# Patient Record
Sex: Male | Born: 1960 | Race: White | Hispanic: No | Smoking: Former smoker
Health system: Southern US, Community
[De-identification: ages and names within clinical notes are randomized; demographics above are authoritative.]

## PROBLEM LIST (undated history)

## (undated) DIAGNOSIS — J8410 Pulmonary fibrosis, unspecified: Secondary | ICD-10-CM

## (undated) DIAGNOSIS — I700 Atherosclerosis of aorta: Secondary | ICD-10-CM

## (undated) DIAGNOSIS — R42 Dizziness and giddiness: Secondary | ICD-10-CM

## (undated) DIAGNOSIS — M1990 Unspecified osteoarthritis, unspecified site: Secondary | ICD-10-CM

## (undated) DIAGNOSIS — M109 Gout, unspecified: Secondary | ICD-10-CM

## (undated) DIAGNOSIS — M25561 Pain in right knee: Secondary | ICD-10-CM

## (undated) DIAGNOSIS — Z8619 Personal history of other infectious and parasitic diseases: Secondary | ICD-10-CM

## (undated) DIAGNOSIS — J449 Chronic obstructive pulmonary disease, unspecified: Secondary | ICD-10-CM

## (undated) DIAGNOSIS — F32A Depression, unspecified: Secondary | ICD-10-CM

## (undated) DIAGNOSIS — F319 Bipolar disorder, unspecified: Secondary | ICD-10-CM

## (undated) DIAGNOSIS — E785 Hyperlipidemia, unspecified: Secondary | ICD-10-CM

## (undated) DIAGNOSIS — H25019 Cortical age-related cataract, unspecified eye: Secondary | ICD-10-CM

## (undated) DIAGNOSIS — F419 Anxiety disorder, unspecified: Secondary | ICD-10-CM

## (undated) DIAGNOSIS — F329 Major depressive disorder, single episode, unspecified: Secondary | ICD-10-CM

## (undated) DIAGNOSIS — F4310 Post-traumatic stress disorder, unspecified: Secondary | ICD-10-CM

## (undated) DIAGNOSIS — J189 Pneumonia, unspecified organism: Secondary | ICD-10-CM

## (undated) HISTORY — PX: EYE SURGERY: SHX253

## (undated) HISTORY — DX: Hyperlipidemia, unspecified: E78.5

## (undated) HISTORY — PX: FOOT SURGERY: SHX648

## (undated) HISTORY — PX: HERNIA REPAIR: SHX51

## (undated) HISTORY — PX: OTHER SURGICAL HISTORY: SHX169

## (undated) HISTORY — DX: Gout, unspecified: M10.9

## (undated) HISTORY — PX: WISDOM TOOTH EXTRACTION: SHX21

## (undated) HISTORY — DX: Anxiety disorder, unspecified: F41.9

## (undated) HISTORY — PX: CATARACT EXTRACTION W/ INTRAOCULAR LENS  IMPLANT, BILATERAL: SHX1307

## (undated) HISTORY — PX: TONSILLECTOMY: SUR1361

## (undated) HISTORY — PX: COLONOSCOPY: SHX174

---
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
## 2007-05-25 ENCOUNTER — Other Ambulatory Visit: Payer: Self-pay

## 2007-05-25 ENCOUNTER — Inpatient Hospital Stay: Payer: Self-pay | Admitting: *Deleted

## 2007-07-13 ENCOUNTER — Ambulatory Visit: Payer: Self-pay | Admitting: Specialist

## 2010-01-24 ENCOUNTER — Emergency Department: Payer: Self-pay | Admitting: Emergency Medicine

## 2013-06-11 ENCOUNTER — Ambulatory Visit: Payer: Self-pay | Admitting: Gastroenterology

## 2013-06-14 LAB — PATHOLOGY REPORT

## 2013-09-17 DIAGNOSIS — F319 Bipolar disorder, unspecified: Secondary | ICD-10-CM | POA: Insufficient documentation

## 2013-09-17 DIAGNOSIS — J449 Chronic obstructive pulmonary disease, unspecified: Secondary | ICD-10-CM | POA: Insufficient documentation

## 2013-09-17 DIAGNOSIS — J8410 Pulmonary fibrosis, unspecified: Secondary | ICD-10-CM | POA: Insufficient documentation

## 2015-06-01 ENCOUNTER — Encounter: Payer: Self-pay | Admitting: *Deleted

## 2015-06-07 NOTE — Discharge Instructions (Signed)
[facility] °[facility] ° °POST OPERATIVE INSTRUCTIONS FOR DR. TROXLER AND DR. FOWLER °[facility] ° ° °1. Take your medication as prescribed.  Pain medication should be taken only as needed. ° °2. Keep the dressing clean, dry and intact. ° °3. Keep your foot elevated above the heart level for the first 48 hours. ° °4. Walking to the bathroom and brief periods of walking are acceptable, unless we have instructed you to be non-weight bearing. ° °5. Always wear your post-op shoe when walking.  Always use your crutches if you are to be non-weight bearing. ° °6. Do not take a shower. Baths are permissible as long as the foot is kept out of the water.  ° °7. Every hour you are awake:  °- Bend your knee 15 times. °- Flex foot 15 times °- Massage calf 15 times ° °8. Call [facility] ([phone]) if any of the following problems occur: °- You develop a temperature or fever. °- The bandage becomes saturated with blood. °- Medication does not stop your pain. °- Injury of the foot occurs. °- Any symptoms of infection including redness, odor, or red streaks running from wound. ° °General Anesthesia, Adult, Care After °Refer to this sheet in the next few weeks. These instructions provide you with information on caring for yourself after your procedure. Your health care provider may also give you more specific instructions. Your treatment has been planned according to current medical practices, but problems sometimes occur. Call your health care provider if you have any problems or questions after your procedure. °WHAT TO EXPECT AFTER THE PROCEDURE °After the procedure, it is typical to experience: °· Sleepiness. °· Nausea and vomiting. °HOME CARE INSTRUCTIONS °· For the first 24 hours after general anesthesia: °¨ Have a responsible person with you. °¨ Do not drive a car. If you are alone, do not take public transportation. °¨ Do not drink alcohol. °¨ Do not take  medicine that has not been prescribed by your health care provider. °¨ Do not sign important papers or make important decisions. °¨ You may resume a normal diet and activities as directed by your health care provider. °· Change bandages (dressings) as directed. °· If you have questions or problems that seem related to general anesthesia, call the hospital and ask for the anesthetist or anesthesiologist on call. °SEEK MEDICAL CARE IF: °· You have nausea and vomiting that continue the day after anesthesia. °· You develop a rash. °SEEK IMMEDIATE MEDICAL CARE IF:  °· You have difficulty breathing. °· You have chest pain. °· You have any allergic problems. °  °This information is not intended to replace advice given to you by your health care provider. Make sure you discuss any questions you have with your health care provider. °  °Document Released: 05/06/2000 Document Revised: 02/18/2014 Document Reviewed: 05/29/2011 °Elsevier Interactive Patient Education ©2016 [employer] ° °

## 2015-06-08 ENCOUNTER — Ambulatory Visit
Admission: RE | Admit: 2015-06-08 | Discharge: 2015-06-08 | Disposition: A | Discharge status: Home / Self Care | Payer: Managed Care, Other (non HMO) | Source: Ambulatory Visit | Attending: Podiatry | Admitting: Podiatry

## 2015-06-08 ENCOUNTER — Ambulatory Visit: Payer: Managed Care, Other (non HMO) | Admitting: Anesthesiology

## 2015-06-08 ENCOUNTER — Encounter
Admission: RE | Disposition: A | Discharge status: Home / Self Care | Payer: Self-pay | Source: Ambulatory Visit | Attending: Podiatry

## 2015-06-08 ENCOUNTER — Encounter: Payer: Self-pay | Admitting: *Deleted

## 2015-06-08 DIAGNOSIS — Z720 Tobacco use: Secondary | ICD-10-CM | POA: Insufficient documentation

## 2015-06-08 DIAGNOSIS — M899 Disorder of bone, unspecified: Secondary | ICD-10-CM | POA: Diagnosis not present

## 2015-06-08 DIAGNOSIS — J449 Chronic obstructive pulmonary disease, unspecified: Secondary | ICD-10-CM | POA: Insufficient documentation

## 2015-06-08 DIAGNOSIS — G8929 Other chronic pain: Secondary | ICD-10-CM | POA: Diagnosis present

## 2015-06-08 HISTORY — DX: Bipolar disorder, unspecified: F31.9

## 2015-06-08 HISTORY — DX: Personal history of other infectious and parasitic diseases: Z86.19

## 2015-06-08 HISTORY — PX: EXCISION PARTIAL PHALANX: SHX6617

## 2015-06-08 HISTORY — DX: Chronic obstructive pulmonary disease, unspecified: J44.9

## 2015-06-08 HISTORY — DX: Depression, unspecified: F32.A

## 2015-06-08 HISTORY — DX: Pain in right knee: M25.561

## 2015-06-08 HISTORY — DX: Major depressive disorder, single episode, unspecified: F32.9

## 2015-06-08 HISTORY — DX: Unspecified osteoarthritis, unspecified site: M19.90

## 2015-06-08 HISTORY — DX: Pulmonary fibrosis, unspecified: J84.10

## 2015-06-08 HISTORY — DX: Dizziness and giddiness: R42

## 2015-06-08 SURGERY — EXCISION, PHALANX, PARTIAL
Anesthesia: General | Site: Foot | Laterality: Right | Wound class: Clean

## 2015-06-08 MED ORDER — ONDANSETRON HCL 4 MG/2ML IJ SOLN
INTRAMUSCULAR | Status: DC | PRN
  Administered 2015-06-08: 4 mg via INTRAVENOUS

## 2015-06-08 MED ORDER — OXYCODONE HCL 5 MG/5ML PO SOLN: 5.0000 mg | Freq: Once | ORAL | Status: DC | PRN

## 2015-06-08 MED ORDER — LACTATED RINGERS IV SOLN
INTRAVENOUS | Status: DC
  Administered 2015-06-08: 08:00:00 via INTRAVENOUS

## 2015-06-08 MED ORDER — CEFAZOLIN SODIUM-DEXTROSE 2-4 GM/100ML-% IV SOLN
2.0000 g | Freq: Once | INTRAVENOUS | Status: AC
  Administered 2015-06-08: 2 g via INTRAVENOUS

## 2015-06-08 MED ORDER — FENTANYL CITRATE (PF) 100 MCG/2ML IJ SOLN
INTRAMUSCULAR | Status: DC | PRN
  Administered 2015-06-08 (×2): 50 ug via INTRAVENOUS

## 2015-06-08 MED ORDER — PROPOFOL 500 MG/50ML IV EMUL
INTRAVENOUS | Status: DC | PRN
Start: 2015-06-08 — End: 2015-06-08
  Administered 2015-06-08: 50 ug/kg/min via INTRAVENOUS

## 2015-06-08 MED ORDER — HYDROMORPHONE HCL 1 MG/ML IJ SOLN: 0.2500 mg | INTRAMUSCULAR | Status: DC | PRN

## 2015-06-08 MED ORDER — BUPIVACAINE HCL (PF) 0.5 % IJ SOLN
INTRAMUSCULAR | Status: DC | PRN
Start: 2015-06-08 — End: 2015-06-08
  Administered 2015-06-08: 2 mL
  Administered 2015-06-08: 5 mL
  Administered 2015-06-08: 1 mL

## 2015-06-08 MED ORDER — MIDAZOLAM HCL 5 MG/5ML IJ SOLN
INTRAMUSCULAR | Status: DC | PRN
  Administered 2015-06-08: 2 mg via INTRAVENOUS

## 2015-06-08 MED ORDER — HYDROCODONE-ACETAMINOPHEN 7.5-325 MG PO TABS: 1.0000 | ORAL_TABLET | Freq: Four times a day (QID) | ORAL | Status: DC | PRN

## 2015-06-08 MED ORDER — LACTATED RINGERS IV SOLN: 500.0000 mL | INTRAVENOUS | Status: DC

## 2015-06-08 MED ORDER — OXYCODONE HCL 5 MG PO TABS: 5.0000 mg | ORAL_TABLET | Freq: Once | ORAL | Status: DC | PRN

## 2015-06-08 MED ORDER — DIPHENHYDRAMINE HCL 50 MG/ML IJ SOLN
INTRAMUSCULAR | Status: DC | PRN
  Administered 2015-06-08: 12.5 mg via INTRAVENOUS

## 2015-06-08 MED ORDER — LIDOCAINE HCL (CARDIAC) 20 MG/ML IV SOLN
INTRAVENOUS | Status: DC | PRN
  Administered 2015-06-08: 40 mg via INTRAVENOUS

## 2015-06-08 MED ORDER — ONDANSETRON HCL 4 MG/2ML IJ SOLN: 4.0000 mg | Freq: Once | INTRAMUSCULAR | Status: DC | PRN

## 2015-06-08 SURGICAL SUPPLY — 50 items
BANDAGE ELASTIC 4 VELCRO NS (GAUZE/BANDAGES/DRESSINGS) ×6 IMPLANT
BENZOIN TINCTURE PRP APPL 2/3 (GAUZE/BANDAGES/DRESSINGS) IMPLANT
BLADE MINI RND TIP GREEN BEAV (BLADE) ×6 IMPLANT
BLADE OSC/SAGITTAL MD 5.5X18 (BLADE) ×3 IMPLANT
BLADE OSC/SAGITTAL MD 9X18.5 (BLADE) IMPLANT
BNDG ESMARK 4X12 TAN STRL LF (GAUZE/BANDAGES/DRESSINGS) ×3 IMPLANT
BNDG GAUZE 4.5X4.1 6PLY STRL (MISCELLANEOUS) ×6 IMPLANT
BNDG STRETCH 4X75 STRL LF (GAUZE/BANDAGES/DRESSINGS) ×6 IMPLANT
CANISTER SUCT 1200ML W/VALVE (MISCELLANEOUS) ×3 IMPLANT
CAST PADDING 3X4FT ST 30246 (SOFTGOODS)
CLOSURE WOUND 1/4X4 (GAUZE/BANDAGES/DRESSINGS)
COVER LIGHT HANDLE UNIVERSAL (MISCELLANEOUS) ×6 IMPLANT
COVER PIN YLW 0.028-062 (MISCELLANEOUS) IMPLANT
CUFF TOURN SGL QUICK 18 (TOURNIQUET CUFF) IMPLANT
DRAPE BILATERAL LIMB T (DRAPES) ×3 IMPLANT
DRAPE FLUOR MINI C-ARM 54X84 (DRAPES) ×3 IMPLANT
DURAPREP 26ML APPLICATOR (WOUND CARE) ×6 IMPLANT
GAUZE PETRO XEROFOAM 1X8 (MISCELLANEOUS) ×3 IMPLANT
GAUZE SPONGE 4X4 12PLY STRL (GAUZE/BANDAGES/DRESSINGS) ×6 IMPLANT
GLOVE BIO SURGEON STRL SZ8 (GLOVE) ×9 IMPLANT
GOWN STRL REUS W/ TWL LRG LVL3 (GOWN DISPOSABLE) ×1 IMPLANT
GOWN STRL REUS W/ TWL XL LVL3 (GOWN DISPOSABLE) ×1 IMPLANT
GOWN STRL REUS W/TWL LRG LVL3 (GOWN DISPOSABLE) ×2
GOWN STRL REUS W/TWL XL LVL3 (GOWN DISPOSABLE) ×2
K-WIRE DBL END TROCAR 6X.045 (WIRE)
K-WIRE DBL END TROCAR 6X.062 (WIRE)
KIT ROOM TURNOVER OR (KITS) ×3 IMPLANT
KWIRE DBL END TROCAR 6X.045 (WIRE) IMPLANT
KWIRE DBL END TROCAR 6X.062 (WIRE) IMPLANT
NEEDLE HYPO 18GX1.5 BLUNT FILL (NEEDLE) ×3 IMPLANT
NEEDLE HYPO 25GX1X1/2 BEV (NEEDLE) ×3 IMPLANT
NS IRRIG 500ML POUR BTL (IV SOLUTION) ×3 IMPLANT
PACK EXTREMITY ARMC (MISCELLANEOUS) ×3 IMPLANT
PAD CAST CTTN 3X4 STRL (SOFTGOODS) IMPLANT
PAD GROUND ADULT SPLIT (MISCELLANEOUS) ×3 IMPLANT
RASP SM TEAR CROSS CUT (RASP) ×3 IMPLANT
SPLINT CAST 1 STEP 4X30 (MISCELLANEOUS) IMPLANT
SPLINT FAST PLASTER 5X30 (CAST SUPPLIES)
SPLINT PLASTER CAST FAST 5X30 (CAST SUPPLIES) IMPLANT
STOCKINETTE STRL 6IN 960660 (GAUZE/BANDAGES/DRESSINGS) ×6 IMPLANT
STRAP BODY AND KNEE 60X3 (MISCELLANEOUS) ×3 IMPLANT
STRIP CLOSURE SKIN 1/4X4 (GAUZE/BANDAGES/DRESSINGS) IMPLANT
SUT ETHILON 5-0 FS-2 18 BLK (SUTURE) ×3 IMPLANT
SUT VIC AB 2-0 SH 27 (SUTURE)
SUT VIC AB 2-0 SH 27XBRD (SUTURE) IMPLANT
SUT VIC AB 3-0 SH 27 (SUTURE)
SUT VIC AB 3-0 SH 27X BRD (SUTURE) IMPLANT
SUT VIC AB 4-0 FS2 27 (SUTURE) ×3 IMPLANT
SUT VICRYL AB 3-0 FS1 BRD 27IN (SUTURE) IMPLANT
SYRINGE 10CC LL (SYRINGE) IMPLANT

## 2015-06-08 NOTE — Transfer of Care (Signed)
Immediate Anesthesia Transfer of Care Note  Patient: Todd Hobbs  Procedure(s) Performed: Procedure(s) with comments: EXCISION BONE PHALANX RIGHTT AND LEFTT 5TH TOES (Right) - LOCAL WITH IVA  Patient Location: PACU  Anesthesia Type: General  Level of Consciousness: awake, alert  and patient cooperative  Airway and Oxygen Therapy: Patient Spontanous Breathing and Patient connected to supplemental oxygen  Post-op Assessment: Post-op Vital signs reviewed, Patient's Cardiovascular Status Stable, Respiratory Function Stable, Patent Airway and No signs of Nausea or vomiting  Post-op Vital Signs: Reviewed and stable  Complications: No apparent anesthesia complications

## 2015-06-08 NOTE — Anesthesia Preprocedure Evaluation (Signed)
Anesthesia Evaluation  Patient identified by MRN, date of birth, ID band Patient awake    Reviewed: Allergy & Precautions, H&P , NPO status , Patient's Chart, lab work & pertinent test results, reviewed documented beta blocker date and time   Airway Mallampati: II  TM Distance: >3 FB Neck ROM: full    Dental no notable dental hx.    Pulmonary COPD,  COPD inhaler, Current Smoker,    Pulmonary exam normal breath sounds clear to auscultation       Cardiovascular Exercise Tolerance: Good negative cardio ROS   Rhythm:regular Rate:Normal     Neuro/Psych negative neurological ROS  negative psych ROS   GI/Hepatic negative GI ROS, Neg liver ROS,   Endo/Other  negative endocrine ROS  Renal/GU negative Renal ROS  negative genitourinary   Musculoskeletal   Abdominal   Peds  Hematology negative hematology ROS (+)   Anesthesia Other Findings   Reproductive/Obstetrics negative OB ROS                             Anesthesia Physical Anesthesia Plan  ASA: II  Anesthesia Plan: General   Post-op Pain Management:    Induction:   Airway Management Planned:   Additional Equipment:   Intra-op Plan:   Post-operative Plan:   Informed Consent: I have reviewed the patients History and Physical, chart, labs and discussed the procedure including the risks, benefits and alternatives for the proposed anesthesia with the patient or authorized representative who has indicated his/her understanding and acceptance.     Plan Discussed with: CRNA  Anesthesia Plan Comments:         Anesthesia Quick Evaluation

## 2015-06-08 NOTE — Op Note (Signed)
Operative note   Surgeon: Dr. Albertine Patricia, DPM.    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 Exostosis fifth toe proximal, middle, and distal phalanx left foot    Postop diagnosis: Same    Procedure:   1. Resection of proximal phalanx head and the lateral portion the middle distal phalanges right fifth toe   2. Resection of proximal phalanx head and lateral portion of the middle and distal phalanges left fifth toe       EBL: Less than 10 cc    Anesthesia:IV sedation delivered by anesthesia team and local anesthetic to both those delivered by me total of 5 cc 0.5% Marcaine mixed with lidocaine and 5050 mixture 1% lidocaine. 0.5% Marcaine plain.    Hemostasis: Ankle tourniquet 250 mils mercury pressure    Specimen: None     Complications: None    Operative indications: Chronic pain unresponsive to conservative care both fifth toes    Procedure:  Patient was brought into the OR and placed on the operating table in thesupine position. After anesthesia was obtained theleft and right lower extremity was prepped and draped in usual sterile fashion.  Operative Report: At this time to his directed to the fifth toe left foot where 2 similar incisions were made over the PIP joint. Slip skin was then removed and the extensor tendon was notified and incised transversely reflected proximally. The head of proximal fashion resected with power equipment. A sagittal saw was used. This time soft tissues freed away from the lateral aspect of the middle distal phalanges and combination of sagittal saw power rasp were used to remove the bony proliferation to these bones on these lateral regions. Once this was accomplished there is checked FluoroScan good reduction of bone proliferation was noted. There is an copiously irrigated the extensor tendon was reapproximated with 4 Vicryl simple  interrupted sutures. Approximately 4 were utilized. Skin was enclosed with 5-0 nylon horizontal mattress and simple up to combination.  This time to his directed to the fifth toe of the right foot where a similar procedure was performed.  Both ears are blocked 0.5% Marcaine plain and sterile compressive dressings placed across wound consisting of Xeroform gauze 4 x 4's Kling and Kerlix. Tourniquet is released and prompt complete vascularity seen to return all digits of the right and left feet.    Patient tolerated the procedure and anesthesia well.  Was transported from the OR to the PACU with all vital signs stable and vascular status intact. To be discharged per routine protocol.  Will follow up in approximately 1 week in the outpatient clinic.

## 2015-06-08 NOTE — Anesthesia Postprocedure Evaluation (Signed)
Anesthesia Post Note  Patient: Todd Hobbs  Procedure(s) Performed: Procedure(s) (LRB): EXCISION BONE PHALANX RIGHTT AND LEFTT 5TH TOES (Right)  Patient location during evaluation: PACU Anesthesia Type: MAC Level of consciousness: awake and alert Pain management: pain level controlled Vital Signs Assessment: post-procedure vital signs reviewed and stable Respiratory status: spontaneous breathing, nonlabored ventilation and respiratory function stable Cardiovascular status: blood pressure returned to baseline and stable Postop Assessment: no signs of nausea or vomiting Anesthetic complications: no    Isaiha D KOVACS

## 2015-06-08 NOTE — H&P (Signed)
H and P has been reviewed and no changes are noted.  

## 2015-06-08 NOTE — Anesthesia Procedure Notes (Signed)
Procedure Name: MAC Performed by: Varian Innes Pre-anesthesia Checklist: Patient identified, Emergency Drugs available, Suction available, Patient being monitored and Timeout performed Patient Re-evaluated:Patient Re-evaluated prior to inductionOxygen Delivery Method: Simple face mask Placement Confirmation: positive ETCO2 and breath sounds checked- equal and bilateral       

## 2015-06-09 ENCOUNTER — Encounter: Payer: Self-pay | Admitting: Podiatry

## 2016-02-22 DIAGNOSIS — Z0000 Encounter for general adult medical examination without abnormal findings: Secondary | ICD-10-CM | POA: Insufficient documentation

## 2016-05-28 ENCOUNTER — Other Ambulatory Visit: Payer: Self-pay | Admitting: Internal Medicine

## 2016-05-28 DIAGNOSIS — R109 Unspecified abdominal pain: Principal | ICD-10-CM

## 2016-05-28 DIAGNOSIS — R102 Pelvic and perineal pain: Secondary | ICD-10-CM

## 2016-05-31 ENCOUNTER — Ambulatory Visit
Admission: RE | Admit: 2016-05-31 | Discharge: 2016-05-31 | Disposition: A | Discharge status: Home / Self Care | Payer: Managed Care, Other (non HMO) | Source: Ambulatory Visit | Attending: Internal Medicine | Admitting: Internal Medicine

## 2016-05-31 DIAGNOSIS — I700 Atherosclerosis of aorta: Secondary | ICD-10-CM | POA: Diagnosis not present

## 2016-05-31 DIAGNOSIS — K8020 Calculus of gallbladder without cholecystitis without obstruction: Secondary | ICD-10-CM | POA: Insufficient documentation

## 2016-05-31 DIAGNOSIS — K4090 Unilateral inguinal hernia, without obstruction or gangrene, not specified as recurrent: Secondary | ICD-10-CM | POA: Diagnosis not present

## 2016-05-31 DIAGNOSIS — R102 Pelvic and perineal pain: Secondary | ICD-10-CM

## 2016-05-31 DIAGNOSIS — R1012 Left upper quadrant pain: Secondary | ICD-10-CM | POA: Insufficient documentation

## 2016-05-31 DIAGNOSIS — R109 Unspecified abdominal pain: Secondary | ICD-10-CM

## 2016-05-31 MED ORDER — IOPAMIDOL (ISOVUE-300) INJECTION 61%
100.0000 mL | Freq: Once | INTRAVENOUS | Status: AC | PRN
  Administered 2016-05-31: 100 mL via INTRAVENOUS

## 2016-06-19 NOTE — Progress Notes (Signed)
06/20/2016 9:22 AM   Katherine Mantle 1961-01-19 [id number]  Referring provider: Kirk Ruths, MD [facility] [place], [place] [address]  Chief Complaint  Patient presents with  . New Patient (Initial Visit)    abdominal/pelvic pain referred by Frazier Richards    HPI: Patient is a 56 year old Caucasian male who presents today as a referral from their PCP, Dr. Kirk Ruths, for microscopic hematuria and lower abdominal and pelvic pain.    Patient was found to have microscopic hematuria on 05/27/2016 with 4-10 RBC's/hpf.  Patient doesn't have a prior history of microscopic hematuria.    His father had kidney and bladder cancer.  He does not have a prior history of recurrent urinary tract infections, nephrolithiasis, trauma to the genitourinary tract or BPH.    Today, he is having symptoms of intermittency.  His UA today is unremarkable.  His PVR is 113 mL.  He is having a pain in his LLQ for the last three months.  He has a history of chronic diarrhea.  He is not experiencing any suprapubic pain or flank pain. He denies any recent fevers, chills, nausea or vomiting.   He had a contrast CT on 05/31/2016 which demonstrated cholelithiasis.  Small right inguinal hernia containing only fat.  Aortic atherosclerosis.  I have independently reviewed the films.    He is a smoker.   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  He has HTN.    He has a high BMI.     PMH: Past Medical History:  Diagnosis Date  . Anxiety   . Arthritis    ankles  . Bipolar 1 disorder ([place])   . COPD (chronic obstructive pulmonary disease) ([place])   . Depression   . Gout   . History of shingles   . HLD (hyperlipidemia)   . Knee pain, right    wears brace  . Pulmonary fibrosis ([place])   . Vertigo    last episode approx 1/17    Surgical History: Past Surgical History:  Procedure Laterality Date  . CATARACT EXTRACTION W/  INTRAOCULAR LENS  IMPLANT, BILATERAL    . COLONOSCOPY    . EXCISION PARTIAL PHALANX Right 06/08/2015   Procedure: EXCISION BONE PHALANX RIGHTT AND LEFTT 5TH TOES---Resection of proximal phalanx head and the lateral portion the middle distal phalanges right fifth toe Resection of proximal phalanx head and lateral portion of the middle and distal phalanges left fifth toe ;  Surgeon: Albertine Patricia, DPM;  Location: [facility];  Service: Podiatry;  Laterality: Right;  LOCAL WITH IVA  . FOOT SURGERY     subtalar fusion Dr Madison Hickman  . TONSILLECTOMY     and adenoidectomy  . WISDOM TOOTH EXTRACTION      Home Medications:  Allergies as of 06/20/2016   No Known Allergies     Medication List       Accurate as of 06/20/16  9:22 AM. Always use your most recent med list.          albuterol 108 (90 Base) MCG/ACT inhaler Commonly known as:  PROVENTIL HFA;VENTOLIN HFA Inhale into the lungs every 6 (six) hours as needed for wheezing or shortness of breath.   HYDROcodone-acetaminophen 7.5-325 MG tablet Commonly known as:  NORCO Take 1 tablet by mouth every 6 (six) hours as needed for moderate pain.   ibuprofen 200 MG tablet Commonly known as:  ADVIL,MOTRIN Take 200 mg by mouth  every 6 (six) hours as needed.   lamoTRIgine 100 MG tablet Commonly known as:  LAMICTAL Take by mouth.   traZODone 50 MG tablet Commonly known as:  DESYREL Take by mouth.       Allergies: No Known Allergies  Family History: Family History  Problem Relation Age of Onset  . Kidney cancer Father   . Melanoma Father   . Bladder Cancer Father   . Lung cancer Father   . Hematuria Father   . Prostate cancer Brother     Social History:  reports that he has been smoking Cigarettes.  He has a 33.00 pack-year smoking history. He has never used smokeless tobacco. He reports that he drinks about 1.2 oz of alcohol per week . He reports that he uses drugs, including Marijuana.  ROS: UROLOGY Frequent  Urination?: No Hard to postpone urination?: No Burning/pain with urination?: No Get up at night to urinate?: No Leakage of urine?: No Urine stream starts and stops?: Yes Trouble starting stream?: No Do you have to strain to urinate?: No Blood in urine?: Yes Urinary tract infection?: No Sexually transmitted disease?: No Injury to kidneys or bladder?: No Painful intercourse?: No Weak stream?: No Erection problems?: No Penile pain?: No  Gastrointestinal Nausea?: No Vomiting?: No Indigestion/heartburn?: No Diarrhea?: Yes Constipation?: No  Constitutional Fever: No Night sweats?: No Weight loss?: No Fatigue?: Yes  Skin Skin rash/lesions?: No Itching?: Yes  Eyes Blurred vision?: No Double vision?: No  Ears/Nose/Throat Sore throat?: No Sinus problems?: No  Hematologic/Lymphatic Swollen glands?: No Easy bruising?: No  Cardiovascular Leg swelling?: No Chest pain?: No  Respiratory Cough?: No Shortness of breath?: No  Endocrine Excessive thirst?: No  Musculoskeletal Back pain?: No Joint pain?: No  Neurological Headaches?: No Dizziness?: No  Psychologic Depression?: Yes Anxiety?: Yes  Physical Exam: BP (!) 143/90   Pulse 81   Ht 5\' 4"  (1.626 m)   Wt 184 lb (83.5 kg)   BMI 31.58 kg/m   Constitutional: Well nourished. Alert and oriented, No acute distress. HEENT: [place] AT, moist mucus membranes. Trachea midline, no masses. Cardiovascular: No clubbing, cyanosis, or edema. Respiratory: Normal respiratory effort, no increased work of breathing. GI: Abdomen is soft, non tender, non distended, no abdominal masses. Liver and spleen not palpable.  No hernias appreciated.  Stool sample for occult testing is not indicated.   GU: No CVA tenderness.  No bladder fullness or masses.  Patient with circumcised phallus.  Urethral meatus is patent.  No penile discharge. No penile lesions or rashes. Scrotum without lesions, cysts, rashes and/or edema.  Testicles are  located scrotally bilaterally. No masses are appreciated in the testicles. Left and right epididymis are normal. Rectal: Patient with  normal sphincter tone. Anus and perineum without scarring or rashes. No rectal masses are appreciated. Prostate is approximately 50 grams, DRE limited to patient's body habitus, no nodules are appreciated. Seminal vesicles are normal. Skin: No rashes, bruises or suspicious lesions. Lymph: No cervical or inguinal adenopathy. Neurologic: Grossly intact, no focal deficits, moving all 4 extremities. Psychiatric: Normal mood and affect.  Laboratory Data:  Urinalysis Unremarkable.  See EPIC.    Pertinent Imaging: CLINICAL DATA:  Left upper quadrant pain and pelvic pain  EXAM: CT ABDOMEN AND PELVIS WITH CONTRAST  TECHNIQUE: Multidetector CT imaging of the abdomen and pelvis was performed using the standard protocol following bolus administration of intravenous contrast.  CONTRAST:  158mL ISOVUE-300 IOPAMIDOL (ISOVUE-300) INJECTION 61%  COMPARISON:  None.  FINDINGS: Lower chest: Normal.  No  hiatal hernia.  Heart size is normal.  Hepatobiliary: The liver parenchyma appears normal. Single partially calcified stone in the otherwise normal appearing gallbladder. No biliary ductal dilatation.  Pancreas: Unremarkable. No pancreatic ductal dilatation or surrounding inflammatory changes.  Spleen: Normal in size without focal abnormality.  Adrenals/Urinary Tract: Adrenal glands are unremarkable. Kidneys are normal, without renal calculi, focal lesion, or hydronephrosis. Bladder is unremarkable.  Stomach/Bowel: Stomach is within normal limits. Appendix appears normal. No evidence of bowel wall thickening, distention, or inflammatory changes.  Vascular/Lymphatic: Aortic atherosclerosis. No enlarged abdominal or pelvic lymph nodes.  Reproductive: Prostate is unremarkable.  Other: There is a small right inguinal hernia containing  only peritoneal fat. No free air or free fluid.  Musculoskeletal:  1. Cholelithiasis. 2. Small right inguinal hernia containing only fat. 3. Aortic atherosclerosis.   Electronically Signed   By: Lorriane Shire M.D.   On: 05/31/2016 17:22   Assessment & Plan:   1. Microscopic hematuria  - I explained to the patient that there are a number of causes that can be associated with blood in the urine, such as stones,  BPH, UTI's, damage to the urinary tract and/or cancer.  - At this time, I felt that the patient warranted further urologic evaluation.   The AUA guidelines state that a CT urogram is the preferred imaging study to evaluate hematuria - the contrast study performed on 05/31/2016 was not the recommended study and may miss some urological conditions - he would like to pursue the CTU  - I explained to the patient that a contrast material will be injected into a vein and that in rare instances, an allergic reaction can result and may even life threatening   The patient denies any allergies to contrast, iodine and/or seafood and is not taking metformin  - Following the imaging study,  I've recommended a cystoscopy. I described how this is performed, typically in an office setting with a flexible cystoscope. We described the risks, benefits, and possible side effects, the most common of which is a minor amount of blood in the urine and/or burning which usually resolves in 24 to 48 hours.    - The patient had the opportunity to ask questions which were answered. Based upon this discussion, the patient is willing to proceed. Therefore, I've ordered: a CT Urogram and cystoscopy.  - The patient will return following all of the above for discussion of the results.   - UA  - Urine culture  - BUN + creatinine    2. Frequency  - patient mentioned that he had issues with urinary frequency at the end of the visit - will address once hematuria work up is completed  - discussed with patient  that since his father had a significant history of GU malignancies and his prostate is enlarged, it would be reasonable to obtain a PSA at this time, patient agrees  - PSA drawn today  3. Left lower quadrant pain  - see above   Return for CT Urogram report and cystoscopy.  These notes generated with voice recognition software. I apologize for typographical errors.  Zara Council, [facility] [address] [place], [place] [address] [phone]

## 2016-06-20 ENCOUNTER — Ambulatory Visit (INDEPENDENT_AMBULATORY_CARE_PROVIDER_SITE_OTHER): Payer: Managed Care, Other (non HMO) | Admitting: Urology

## 2016-06-20 ENCOUNTER — Encounter: Payer: Self-pay | Admitting: Urology

## 2016-06-20 VITALS — BP 143/90 | HR 81 | Ht 64.0 in | Wt 184.0 lb

## 2016-06-20 DIAGNOSIS — R350 Frequency of micturition: Secondary | ICD-10-CM

## 2016-06-20 DIAGNOSIS — R109 Unspecified abdominal pain: Secondary | ICD-10-CM | POA: Diagnosis not present

## 2016-06-20 DIAGNOSIS — R3129 Other microscopic hematuria: Secondary | ICD-10-CM | POA: Diagnosis not present

## 2016-06-20 LAB — URINALYSIS, COMPLETE
BILIRUBIN UA: NEGATIVE
Glucose, UA: NEGATIVE
Ketones, UA: NEGATIVE
LEUKOCYTES UA: NEGATIVE
NITRITE UA: NEGATIVE
PH UA: 7 (ref 5.0–7.5)
Protein, UA: NEGATIVE
Specific Gravity, UA: 1.01 (ref 1.005–1.030)
UUROB: 0.2 mg/dL (ref 0.2–1.0)

## 2016-06-20 LAB — MICROSCOPIC EXAMINATION
Bacteria, UA: NONE SEEN
EPITHELIAL CELLS (NON RENAL): NONE SEEN /HPF (ref 0–10)
WBC, UA: NONE SEEN /hpf (ref 0–?)

## 2016-06-20 LAB — BLADDER SCAN AMB NON-IMAGING: SCAN RESULT: 113

## 2016-06-20 NOTE — Patient Instructions (Addendum)
Hematuria, Adult Hematuria is blood in your urine. It can be caused by a bladder infection, kidney infection, prostate infection, kidney stone, or cancer of your urinary tract. Infections can usually be treated with medicine, and a kidney stone usually will pass through your urine. If neither of these is the cause of your hematuria, further workup to find out the reason may be needed. It is very important that you tell your health care provider about any blood you see in your urine, even if the blood stops without treatment or happens without causing pain. Blood in your urine that happens and then stops and then happens again can be a symptom of a very serious condition. Also, pain is not a symptom in the initial stages of many urinary cancers. Follow these instructions at home:  Drink lots of fluid, 3-4 quarts a day. If you have been diagnosed with an infection, cranberry juice is especially recommended, in addition to large amounts of water.  Avoid caffeine, tea, and carbonated beverages because they tend to irritate the bladder.  Avoid alcohol because it may irritate the prostate.  Take all medicines as directed by your health care provider.  If you were prescribed an antibiotic medicine, finish it all even if you start to feel better.  If you have been diagnosed with a kidney stone, follow your health care provider's instructions regarding straining your urine to catch the stone.  Empty your bladder often. Avoid holding urine for long periods of time.  After a bowel movement, women should cleanse front to back. Use each tissue only once.  Empty your bladder before and after sexual intercourse if you are a male. Contact a health care provider if:  You develop back pain.  You have a fever.  You have a feeling of sickness in your stomach (nausea) or vomiting.  Your symptoms are not better in 3 days. Return sooner if you are getting worse. Get help right away if:  You develop  severe vomiting and are unable to keep the medicine down.  You develop severe back or abdominal pain despite taking your medicines.  You begin passing a large amount of blood or clots in your urine.  You feel extremely weak or faint, or you pass out. This information is not intended to replace advice given to you by your health care provider. Make sure you discuss any questions you have with your health care provider. Document Released: 01/28/2005 Document Revised: 07/06/2015 Document Reviewed: 09/28/2012 Elsevier Interactive Patient Education  2017 [employer]  CT Scan A computed tomography (CT) scan is a specialized X-ray scan. It uses X-rays and a computer to make pictures of different areas of your body. A CT scan can offer more detailed information than a regular X-ray exam. The CT scan provides data about internal organs, soft tissue structures, blood vessels, and bones. The CT scanner is a large machine that takes pictures of your body as you move through the opening. Tell a health care provider about:  Any allergies you have.  All medicines you are taking, including vitamins, herbs, eye drops, creams, and over-the-counter medicines.  Any problems you or family members have had with anesthetic medicines.  Any blood disorders you have.  Any surgeries you have had.  Any medical conditions you have. What are the risks? Generally, this is a safe procedure. However, as with any procedure, problems can occur. Possible problems include:  An allergic reaction to the contrast material.  Development of cancer from excessive exposure to   radiation. The risk of this is small. What happens before the procedure?  The day before the test, stop drinking caffeinated beverages. These include energy drinks, tea, soda, coffee, and hot chocolate.  On the day of the test:  About 4 hours before the test, stop eating and drinking anything but water as advised by your health care  provider.  Avoid wearing jewelry. You will have to partly or fully undress and wear a hospital gown. What happens during the procedure?  You will be asked to lie on a table with your arms above your head.  If contrast dye is to be used for the test, an IV tube will be inserted in your arm. The contrast dye will be injected into the IV tube. You might feel warm, or you may get a metallic taste in your mouth.  The table you will be lying on will move into a large machine that will do the scanning.  You will be able to see, hear, and talk to the person running the machine while you are in it. Follow that person's directions.  The CT machine will move around you to take pictures. Do not move while it is scanning. This helps to get a good image.  When the best possible pictures have been taken, the machine will be turned off. The table will be moved out of the machine. The IV tube will then be removed. What happens after the procedure? Ask your health care provider when to follow up for your test results. This information is not intended to replace advice given to you by your health care provider. Make sure you discuss any questions you have with your health care provider. Document Released: 03/07/2004 Document Revised: 07/06/2015 Document Reviewed: 10/05/2012 Elsevier Interactive Patient Education  2017 [place] A computed tomography (CT) scan is a specialized X-ray scan. It uses X-rays and a computer to make pictures of different areas of your body. A CT scan can offer more detailed information than a regular X-ray exam. The CT scan provides data about internal organs, soft tissue structures, blood vessels, and bones. The CT scanner is a large machine that takes pictures of your body as you move through the opening. Tell a health care provider about:  Any allergies you have.  All medicines you are taking, including vitamins, herbs, eye drops, creams, and over-the-counter  medicines.  Any problems you or family members have had with anesthetic medicines.  Any blood disorders you have.  Any surgeries you have had.  Any medical conditions you have. What are the risks? Generally, this is a safe procedure. However, as with any procedure, problems can occur. Possible problems include:  An allergic reaction to the contrast material.  Development of cancer from excessive exposure to radiation. The risk of this is small. What happens before the procedure?  The day before the test, stop drinking caffeinated beverages. These include energy drinks, tea, soda, coffee, and hot chocolate.  On the day of the test:  About 4 hours before the test, stop eating and drinking anything but water as advised by your health care provider.  Avoid wearing jewelry. You will have to partly or fully undress and wear a hospital gown. What happens during the procedure?  You will be asked to lie on a table with your arms above your head.  If contrast dye is to be used for the test, an IV tube will be inserted in your arm. The contrast dye will be injected  into the IV tube. You might feel warm, or you may get a metallic taste in your mouth.  The table you will be lying on will move into a large machine that will do the scanning.  You will be able to see, hear, and talk to the person running the machine while you are in it. Follow that person's directions.  The CT machine will move around you to take pictures. Do not move while it is scanning. This helps to get a good image.  When the best possible pictures have been taken, the machine will be turned off. The table will be moved out of the machine. The IV tube will then be removed. What happens after the procedure? Ask your health care provider when to follow up for your test results. This information is not intended to replace advice given to you by your health care provider. Make sure you discuss any questions you have with your  health care provider. Document Released: 03/07/2004 Document Revised: 07/06/2015 Document Reviewed: 10/05/2012 Elsevier Interactive Patient Education  2017 [place].  Cystoscopy Cystoscopy is a procedure that is used to help diagnose and sometimes treat conditions that affect that lower urinary tract. The lower urinary tract includes the bladder and the tube that drains urine from the bladder out of the body (urethra). Cystoscopy is performed with a thin, tube-shaped instrument with a light and camera at the end (cystoscope). The cystoscope may be hard (rigid) or flexible, depending on the goal of the procedure.The cystoscope is inserted through the urethra, into the bladder. Cystoscopy may be recommended if you have:  Urinary tractinfections that keep coming back (recurring).  Blood in the urine (hematuria).  Loss of bladder control (urinary incontinence) or an overactive bladder.  Unusual cells found in a urine sample.  A blockage in the urethra.  Painful urination.  An abnormality in the bladder found during an intravenous pyelogram (IVP) or CT scan. Cystoscopy may also be done to remove a sample of tissue to be examined under a microscope (biopsy). Tell a health care provider about:  Any allergies you have.  All medicines you are taking, including vitamins, herbs, eye drops, creams, and over-the-counter medicines.  Any problems you or family members have had with anesthetic medicines.  Any blood disorders you have.  Any surgeries you have had.  Any medical conditions you have.  Whether you are pregnant or may be pregnant. What are the risks? Generally, this is a safe procedure. However, problems may occur, including:  Infection.  Bleeding.  Allergic reactions to medicines.  Damage to other structures or organs. What happens before the procedure?  Ask your health care provider about:  Changing or stopping your regular medicines. This is especially important  if you are taking diabetes medicines or blood thinners.  Taking medicines such as aspirin and ibuprofen. These medicines can thin your blood. Do not take these medicines before your procedure if your health care provider instructs you not to.  Follow instructions from your health care provider about eating or drinking restrictions.  You may be given antibiotic medicine to help prevent infection.  You may have an exam or testing, such as X-rays of the bladder, urethra, or kidneys.  You may have urine tests to check for signs of infection.  Plan to have someone take you home after the procedure. What happens during the procedure?  To reduce your risk of infection,your health care team will wash or sanitize their hands.  You will be given one  or more of the following:  A medicine to help you relax (sedative).  A medicine to numb the area (local anesthetic).  The area around the opening of your urethra will be cleaned.  The cystoscope will be passed through your urethra into your bladder.  Germ-free (sterile)fluid will flow through the cystoscope to fill your bladder. The fluid will stretch your bladder so that your surgeon can clearly examine your bladder walls.  The cystoscope will be removed and your bladder will be emptied. The procedure may vary among health care providers and hospitals. What happens after the procedure?  You may have some soreness or pain in your abdomen and urethra. Medicines will be available to help you.  You may have some blood in your urine.  Do not drive for 24 hours if you received a sedative. This information is not intended to replace advice given to you by your health care provider. Make sure you discuss any questions you have with your health care provider. Document Released: 01/26/2000 Document Revised: 06/08/2015 Document Reviewed: 12/15/2014 Elsevier Interactive Patient Education  2017 [employer].

## 2016-06-21 ENCOUNTER — Telehealth: Payer: Self-pay

## 2016-06-21 LAB — BUN+CREAT
BUN/Creatinine Ratio: 12 (ref 9–20)
BUN: 11 mg/dL (ref 6–24)
Creatinine, Ser: 0.9 mg/dL (ref 0.76–1.27)
GFR calc Af Amer: 112 mL/min/{1.73_m2} (ref >59)
GFR calc non Af Amer: 97 mL/min/{1.73_m2} (ref >59)

## 2016-06-21 LAB — PSA: PROSTATE SPECIFIC AG, SERUM: 1.4 ng/mL (ref 0.0–4.0)

## 2016-06-21 NOTE — Telephone Encounter (Signed)
-----   Message from Nori Riis, PA-C sent at 06/21/2016  7:45 AM EDT ----- Please let the patient know that his PSA was normal.

## 2016-06-21 NOTE — Telephone Encounter (Signed)
Spoke with pt in reference to PSA results. Pt voiced understanding.  

## 2016-06-23 LAB — CULTURE, URINE COMPREHENSIVE

## 2016-07-10 ENCOUNTER — Ambulatory Visit
Admission: RE | Admit: 2016-07-10 | Discharge: 2016-07-10 | Disposition: A | Discharge status: Home / Self Care | Payer: Managed Care, Other (non HMO) | Source: Ambulatory Visit | Attending: Urology | Admitting: Urology

## 2016-07-10 DIAGNOSIS — R3129 Other microscopic hematuria: Secondary | ICD-10-CM

## 2016-07-10 DIAGNOSIS — K4090 Unilateral inguinal hernia, without obstruction or gangrene, not specified as recurrent: Secondary | ICD-10-CM | POA: Diagnosis not present

## 2016-07-10 DIAGNOSIS — K8020 Calculus of gallbladder without cholecystitis without obstruction: Secondary | ICD-10-CM | POA: Insufficient documentation

## 2016-07-10 MED ORDER — IOPAMIDOL (ISOVUE-300) INJECTION 61%
125.0000 mL | Freq: Once | INTRAVENOUS | Status: AC | PRN
  Administered 2016-07-10: 125 mL via INTRAVENOUS

## 2016-07-18 ENCOUNTER — Ambulatory Visit (INDEPENDENT_AMBULATORY_CARE_PROVIDER_SITE_OTHER): Payer: Managed Care, Other (non HMO) | Admitting: Urology

## 2016-07-18 DIAGNOSIS — R3129 Other microscopic hematuria: Secondary | ICD-10-CM | POA: Diagnosis not present

## 2016-07-18 DIAGNOSIS — D494 Neoplasm of unspecified behavior of bladder: Secondary | ICD-10-CM

## 2016-07-18 DIAGNOSIS — Z125 Encounter for screening for malignant neoplasm of prostate: Secondary | ICD-10-CM

## 2016-07-18 LAB — MICROSCOPIC EXAMINATION: BACTERIA UA: NONE SEEN

## 2016-07-18 LAB — URINALYSIS, COMPLETE
Bilirubin, UA: NEGATIVE
GLUCOSE, UA: NEGATIVE
KETONES UA: NEGATIVE
Leukocytes, UA: NEGATIVE
NITRITE UA: NEGATIVE
Protein, UA: NEGATIVE
Specific Gravity, UA: >1.03 — ABNORMAL HIGH (ref 1.005–1.030)
UUROB: 0.2 mg/dL (ref 0.2–1.0)
pH, UA: 5.5 (ref 5.0–7.5)

## 2016-07-18 MED ORDER — LIDOCAINE HCL 2 % EX GEL
1.0000 "application " | Freq: Once | CUTANEOUS | Status: AC
  Administered 2016-07-18: 1 via URETHRAL

## 2016-07-18 MED ORDER — CIPROFLOXACIN HCL 500 MG PO TABS
500.0000 mg | ORAL_TABLET | Freq: Once | ORAL | Status: AC
  Administered 2016-07-18: 500 mg via ORAL

## 2016-07-18 NOTE — Progress Notes (Signed)
   07/18/16  CC:  Chief Complaint  Patient presents with  . Cysto    HPI: The patient is a 56 year old gentleman presents today for completion of his microscopic hematuria workup. His seat T urogram was unremarkable.  Physical family history of kidney and bladder cancer in his father. He is a smoker.   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   There were no vitals taken for this visit. NED. A&Ox3.   No respiratory distress   Abd soft, NT, ND Normal phallus with bilateral descended testicles  Cystoscopy Procedure Note  Patient identification was confirmed, informed consent was obtained, and patient was prepped using Betadine solution.  Lidocaine jelly was administered per urethral meatus.    Preoperative abx where received prior to procedure.     Pre-Procedure: - Inspection reveals a normal caliber ureteral meatus.  Procedure: The flexible cystoscope was introduced without difficulty - No urethral strictures/lesions are present. 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 - No bladder stones - No trabeculation  Retroflexion shows no intravesical lobe   Post-Procedure: - Patient tolerated the procedure well  Assessment/ Plan:  1. Bladder tumor I discussed with the patient that it is small lesion in the posterior bladder wall of his bladder that is concerning for an early malignancy. We discussed the next step would be cystoscopy, bladder biopsy versus transurethral resection of bladder tumor. I discussed the risks, benefits, and indications of this procedure. He understands the risks include but are not limited to bleeding, infection, iatrogenic injury, need for Foley catheter, need for repeat procedures. All questions were answered. The patient is agreeable to proceeding.   2. Microscopic hematuria -2/2  above  3. Prostate cancer screening Up to date

## 2016-07-20 LAB — URINE CULTURE

## 2016-07-22 ENCOUNTER — Telehealth: Payer: Self-pay | Admitting: Radiology

## 2016-07-22 ENCOUNTER — Encounter: Payer: Self-pay | Admitting: *Deleted

## 2016-07-22 ENCOUNTER — Other Ambulatory Visit: Payer: Self-pay | Admitting: Radiology

## 2016-07-22 ENCOUNTER — Encounter
Admission: RE | Admit: 2016-07-22 | Discharge: 2016-07-22 | Disposition: A | Discharge status: Home / Self Care | Payer: Managed Care, Other (non HMO) | Source: Ambulatory Visit | Attending: Urology | Admitting: Urology

## 2016-07-22 DIAGNOSIS — D494 Neoplasm of unspecified behavior of bladder: Secondary | ICD-10-CM

## 2016-07-22 NOTE — Telephone Encounter (Signed)
LMOM to confirm TURBT scheduled with Dr Pilar Jarvis on 07/24/16, pre-admit phone interview today, 07/22/16 between 9am-1pm & to call day prior to surgery for arrival time to [facility]. This information was also given in the office on 07/18/16. Pt voiced understanding at that time.

## 2016-07-22 NOTE — Patient Instructions (Signed)
  Your procedure is scheduled on: 07/24/16 Report to Same Day Surgery 2nd floor medical mall [facility] Entrance-take elevator on left to 2nd floor.  Check in with surgery information desk.) To find out your arrival time please call [phone] between 1PM - 3PM on  07/23/16  Remember: Instructions that are not followed completely may result in serious medical risk, up to and including death, or upon the discretion of your surgeon and anesthesiologist your surgery may need to be rescheduled.    _x___ 1. Do not eat food or drink liquids after midnight. No gum chewing or  hard candies.     __x__ 2. No Alcohol for 24 hours before or after surgery.   __x__3. No Smoking for 24 prior to surgery.   ____  4. Bring all medications with you on the day of surgery if instructed.    __x__ 5. Notify your doctor if there is any change in your medical condition     (cold, fever, infections).     Do not wear jewelry, make-up, hairpins, clips or nail polish.  Do not wear lotions, powders, or perfumes. You may wear deodorant.  Do not shave 48 hours prior to surgery. Men may shave face and neck.  Do not bring valuables to the hospital.    [facility] is not responsible for any belongings or valuables.               Contacts, dentures or bridgework may not be worn into surgery.  Leave your suitcase in the car. After surgery it may be brought to your room.  For patients admitted to the hospital, discharge time is determined by your                       treatment team.   Patients discharged the day of surgery will not be allowed to drive home.  You will need someone to drive you home and stay with you the night of your procedure.    Please read over the following fact sheets that you were given:   [facility] for Surgery and or MRSA Information          __ Take anti-hypertensive (unless it includes a diuretic), cardiac, seizure, asthma,     anti-reflux and psychiatric medicines. These  include:  1. NONE  2.  3.  4.  5.  6.  ____Fleets enema or Magnesium Citrate as directed.   ____ Use CHG Soap or sage wipes as directed on instruction sheet   __X__ Use inhalers on the day of surgery and bring to hospital day of surgery  ____ Stop Metformin and Janumet 2 days prior to surgery.    ____ Take 1/2 of usual insulin dose the night before surgery and none on the morning     surgery.   ____ Follow recommendations from Cardiologist, Pulmonologist or PCP regarding  stopping Aspirin, Coumadin, Pllavix ,Eliquis, Effient, or Pradaxa, and Pletal.  X____Stop Anti-inflammatories such as Advil, Aleve, Ibuprofen, Motrin, Naproxen, Naprosyn, Goodies powders or aspirin products. OK to take Tylenol and Celebrex.   ____ Stop supplements until after surgery.  But may continue Vitamin D, Vitamin B, and multivitamin.   ____ Bring C-Pap to the hospital.

## 2016-07-23 MED ORDER — CEFAZOLIN SODIUM-DEXTROSE 2-4 GM/100ML-% IV SOLN
2.0000 g | INTRAVENOUS | Status: AC
  Administered 2016-07-24: 2 g via INTRAVENOUS

## 2016-07-24 ENCOUNTER — Ambulatory Visit: Payer: Managed Care, Other (non HMO) | Admitting: Certified Registered Nurse Anesthetist

## 2016-07-24 ENCOUNTER — Telehealth: Payer: Self-pay | Admitting: Urology

## 2016-07-24 ENCOUNTER — Ambulatory Visit
Admission: RE | Admit: 2016-07-24 | Discharge: 2016-07-24 | Disposition: A | Discharge status: Home / Self Care | Payer: Managed Care, Other (non HMO) | Source: Ambulatory Visit | Attending: Urology | Admitting: Urology

## 2016-07-24 ENCOUNTER — Encounter
Admission: RE | Disposition: A | Discharge status: Home / Self Care | Payer: Self-pay | Source: Ambulatory Visit | Attending: Urology

## 2016-07-24 DIAGNOSIS — E669 Obesity, unspecified: Secondary | ICD-10-CM | POA: Insufficient documentation

## 2016-07-24 DIAGNOSIS — J8410 Pulmonary fibrosis, unspecified: Secondary | ICD-10-CM | POA: Insufficient documentation

## 2016-07-24 DIAGNOSIS — Z6831 Body mass index (BMI) 31.0-31.9, adult: Secondary | ICD-10-CM | POA: Insufficient documentation

## 2016-07-24 DIAGNOSIS — F319 Bipolar disorder, unspecified: Secondary | ICD-10-CM | POA: Diagnosis not present

## 2016-07-24 DIAGNOSIS — F419 Anxiety disorder, unspecified: Secondary | ICD-10-CM | POA: Diagnosis not present

## 2016-07-24 DIAGNOSIS — M1990 Unspecified osteoarthritis, unspecified site: Secondary | ICD-10-CM | POA: Diagnosis not present

## 2016-07-24 DIAGNOSIS — M109 Gout, unspecified: Secondary | ICD-10-CM | POA: Insufficient documentation

## 2016-07-24 DIAGNOSIS — J449 Chronic obstructive pulmonary disease, unspecified: Secondary | ICD-10-CM | POA: Diagnosis not present

## 2016-07-24 DIAGNOSIS — R3129 Other microscopic hematuria: Secondary | ICD-10-CM | POA: Insufficient documentation

## 2016-07-24 DIAGNOSIS — F17210 Nicotine dependence, cigarettes, uncomplicated: Secondary | ICD-10-CM | POA: Diagnosis not present

## 2016-07-24 DIAGNOSIS — E785 Hyperlipidemia, unspecified: Secondary | ICD-10-CM | POA: Diagnosis not present

## 2016-07-24 DIAGNOSIS — D494 Neoplasm of unspecified behavior of bladder: Secondary | ICD-10-CM | POA: Insufficient documentation

## 2016-07-24 HISTORY — PX: TRANSURETHRAL RESECTION OF BLADDER TUMOR: SHX2575

## 2016-07-24 LAB — URINE DRUG SCREEN, QUALITATIVE (ARMC ONLY)
Amphetamines, Ur Screen: NOT DETECTED
Barbiturates, Ur Screen: NOT DETECTED
Benzodiazepine, Ur Scrn: NOT DETECTED
COCAINE METABOLITE, UR ~~LOC~~: NOT DETECTED
Cannabinoid 50 Ng, Ur ~~LOC~~: POSITIVE — AB
MDMA (ECSTASY) UR SCREEN: NOT DETECTED
METHADONE SCREEN, URINE: NOT DETECTED
Opiate, Ur Screen: NOT DETECTED
Phencyclidine (PCP) Ur S: NOT DETECTED
TRICYCLIC, UR SCREEN: NOT DETECTED

## 2016-07-24 SURGERY — TURBT (TRANSURETHRAL RESECTION OF BLADDER TUMOR)
Anesthesia: General | Site: Bladder | Wound class: Clean Contaminated

## 2016-07-24 MED ORDER — FAMOTIDINE 20 MG PO TABS
ORAL_TABLET | ORAL | Status: AC
  Administered 2016-07-24: 20 mg via ORAL
  Filled 2016-07-24: qty 1

## 2016-07-24 MED ORDER — LACTATED RINGERS IV SOLN
INTRAVENOUS | Status: DC
  Administered 2016-07-24: 13:00:00 via INTRAVENOUS

## 2016-07-24 MED ORDER — HYDROCODONE-ACETAMINOPHEN 5-325 MG PO TABS: 1.0000 | ORAL_TABLET | ORAL | 0 refills | Status: DC | PRN

## 2016-07-24 MED ORDER — ONDANSETRON HCL 4 MG/2ML IJ SOLN
INTRAMUSCULAR | Status: DC | PRN
  Administered 2016-07-24: 4 mg via INTRAVENOUS

## 2016-07-24 MED ORDER — CEPHALEXIN 500 MG PO CAPS: 500.0000 mg | ORAL_CAPSULE | Freq: Three times a day (TID) | ORAL | 0 refills | Status: DC

## 2016-07-24 MED ORDER — LIDOCAINE HCL (CARDIAC) 20 MG/ML IV SOLN
INTRAVENOUS | Status: DC | PRN
  Administered 2016-07-24: 80 mg via INTRAVENOUS

## 2016-07-24 MED ORDER — MIDAZOLAM HCL 2 MG/2ML IJ SOLN
INTRAMUSCULAR | Status: AC
  Filled 2016-07-24: qty 2

## 2016-07-24 MED ORDER — ONDANSETRON HCL 4 MG/2ML IJ SOLN: 4.0000 mg | Freq: Once | INTRAMUSCULAR | Status: DC | PRN

## 2016-07-24 MED ORDER — FENTANYL CITRATE (PF) 100 MCG/2ML IJ SOLN
INTRAMUSCULAR | Status: AC
  Filled 2016-07-24: qty 2

## 2016-07-24 MED ORDER — MIDAZOLAM HCL 2 MG/2ML IJ SOLN
INTRAMUSCULAR | Status: DC | PRN
  Administered 2016-07-24: 2 mg via INTRAVENOUS

## 2016-07-24 MED ORDER — FAMOTIDINE 20 MG PO TABS
20.0000 mg | ORAL_TABLET | Freq: Once | ORAL | Status: AC
  Administered 2016-07-24: 20 mg via ORAL

## 2016-07-24 MED ORDER — FENTANYL CITRATE (PF) 100 MCG/2ML IJ SOLN
25.0000 ug | INTRAMUSCULAR | Status: DC | PRN
  Administered 2016-07-24 (×4): 25 ug via INTRAVENOUS

## 2016-07-24 MED ORDER — PROPOFOL 10 MG/ML IV BOLUS
INTRAVENOUS | Status: AC
  Filled 2016-07-24: qty 20

## 2016-07-24 MED ORDER — DEXAMETHASONE SODIUM PHOSPHATE 10 MG/ML IJ SOLN
INTRAMUSCULAR | Status: DC | PRN
  Administered 2016-07-24: 10 mg via INTRAVENOUS

## 2016-07-24 MED ORDER — DEXAMETHASONE SODIUM PHOSPHATE 10 MG/ML IJ SOLN
INTRAMUSCULAR | Status: AC
  Filled 2016-07-24: qty 1

## 2016-07-24 MED ORDER — CEFAZOLIN SODIUM-DEXTROSE 2-4 GM/100ML-% IV SOLN
INTRAVENOUS | Status: AC
  Filled 2016-07-24: qty 100

## 2016-07-24 MED ORDER — LIDOCAINE HCL (PF) 2 % IJ SOLN
INTRAMUSCULAR | Status: AC
  Filled 2016-07-24: qty 2

## 2016-07-24 MED ORDER — PHENYLEPHRINE HCL 10 MG/ML IJ SOLN
INTRAMUSCULAR | Status: DC | PRN
  Administered 2016-07-24: 100 ug via INTRAVENOUS
  Administered 2016-07-24: 200 ug via INTRAVENOUS

## 2016-07-24 MED ORDER — FENTANYL CITRATE (PF) 100 MCG/2ML IJ SOLN
INTRAMUSCULAR | Status: DC | PRN
  Administered 2016-07-24 (×2): 50 ug via INTRAVENOUS

## 2016-07-24 MED ORDER — PROPOFOL 10 MG/ML IV BOLUS
INTRAVENOUS | Status: DC | PRN
  Administered 2016-07-24: 200 mg via INTRAVENOUS

## 2016-07-24 SURGICAL SUPPLY — 27 items
BACTOSHIELD CHG 4% 4OZ (MISCELLANEOUS) ×2
BAG DRAIN CYSTO-URO LG1000N (MISCELLANEOUS) ×3 IMPLANT
BAG URO DRAIN 2000ML W/SPOUT (MISCELLANEOUS) IMPLANT
CATH FOL LEG HOLDER (MISCELLANEOUS) IMPLANT
CATH FOLEY 2WAY  5CC 16FR (CATHETERS)
CATH FOLEY 3WAY 30CC 24FR (CATHETERS)
CATH URTH 16FR FL 2W BLN LF (CATHETERS) IMPLANT
CATH URTH STD 24FR FL 3W 2 (CATHETERS) IMPLANT
DRAPE INCISE 23X17 IOBAN STRL (DRAPES) ×2
DRAPE INCISE IOBAN 23X17 STRL (DRAPES) ×1 IMPLANT
ELECT LOOP 22F BIPOLAR SML (ELECTROSURGICAL) ×3
ELECT REM PT RETURN 9FT ADLT (ELECTROSURGICAL) ×3
ELECTRODE LOOP 22F BIPOLAR SML (ELECTROSURGICAL) ×1 IMPLANT
ELECTRODE REM PT RTRN 9FT ADLT (ELECTROSURGICAL) ×1 IMPLANT
EVACUATOR ELLICK (MISCELLANEOUS) ×3 IMPLANT
GLOVE BIO SURGEON STRL SZ7.5 (GLOVE) ×3 IMPLANT
GOWN STRL REUS W/ TWL LRG LVL3 (GOWN DISPOSABLE) ×2 IMPLANT
GOWN STRL REUS W/TWL LRG LVL3 (GOWN DISPOSABLE) ×4
KIT RM TURNOVER CYSTO AR (KITS) ×3 IMPLANT
LOOP CUT BIPOLAR 24F LRG (ELECTROSURGICAL) IMPLANT
PACK CYSTO AR (MISCELLANEOUS) ×3 IMPLANT
SCRUB CHG 4% DYNA-HEX 4OZ (MISCELLANEOUS) ×1 IMPLANT
SET IRRIG Y TYPE TUR BLADDER L (SET/KITS/TRAYS/PACK) ×3 IMPLANT
SOL .9 NS 3000ML IRR  AL (IV SOLUTION) ×4
SOL .9 NS 3000ML IRR UROMATIC (IV SOLUTION) ×2 IMPLANT
SURGILUBE 2OZ TUBE FLIPTOP (MISCELLANEOUS) ×3 IMPLANT
WATER STERILE IRR 1000ML POUR (IV SOLUTION) ×3 IMPLANT

## 2016-07-24 NOTE — Anesthesia Post-op Follow-up Note (Cosign Needed)
Anesthesia QCDR form completed.        

## 2016-07-24 NOTE — Anesthesia Procedure Notes (Signed)
Procedure Name: LMA Insertion Date/Time: 07/24/2016 1:22 PM Performed by: Johnna Acosta Pre-anesthesia Checklist: Patient identified, Emergency Drugs available, Suction available, Patient being monitored and Timeout performed Patient Re-evaluated:Patient Re-evaluated prior to inductionOxygen Delivery Method: Circle system utilized Preoxygenation: Pre-oxygenation with 100% oxygen LMA: LMA inserted LMA Size: 5.0 Tube type: Oral Number of attempts: 1 Placement Confirmation: positive ETCO2 and breath sounds checked- equal and bilateral Tube secured with: Tape Dental Injury: Teeth and Oropharynx as per pre-operative assessment

## 2016-07-24 NOTE — Telephone Encounter (Signed)
-----   Message from Nickie Retort, MD sent at 07/24/2016  1:51 PM EDT ----- Patient needs to see me in one week to discuss pathology results. Ok to [place] just before lunch if needed.

## 2016-07-24 NOTE — Telephone Encounter (Signed)
done

## 2016-07-24 NOTE — Anesthesia Preprocedure Evaluation (Signed)
Anesthesia Evaluation  Patient identified by MRN, date of birth, ID band Patient awake    Reviewed: Allergy & Precautions  Airway Mallampati: III       Dental  (+) Teeth Intact   Pulmonary COPD,  COPD inhaler, Current Smoker,     + decreased breath sounds      Cardiovascular Exercise Tolerance: Poor  Rhythm:Regular     Neuro/Psych Anxiety Depression Bipolar Disorder negative neurological ROS     GI/Hepatic negative GI ROS, Neg liver ROS,   Endo/Other  negative endocrine ROS  Renal/GU negative Renal ROS     Musculoskeletal   Abdominal (+) + obese,   Peds negative pediatric ROS (+)  Hematology   Anesthesia Other Findings   Reproductive/Obstetrics                             Anesthesia Physical Anesthesia Plan  ASA: III  Anesthesia Plan: General   Post-op Pain Management:    Induction: Intravenous  PONV Risk Score and Plan: 1 and Ondansetron  Airway Management Planned: LMA  Additional Equipment:   Intra-op Plan:   Post-operative Plan: Extubation in OR  Informed Consent:   Plan Discussed with: CRNA  Anesthesia Plan Comments:         Anesthesia Quick Evaluation

## 2016-07-24 NOTE — Transfer of Care (Signed)
Immediate Anesthesia Transfer of Care Note  Patient: Todd Hobbs  Procedure(s) Performed: Procedure(s): TRANSURETHRAL RESECTION OF BLADDER TUMOR (TURBT) (SMALL) (N/A)  Patient Location: PACU  Anesthesia Type:General  Level of Consciousness: sedated  Airway & Oxygen Therapy: Patient Spontanous Breathing and Patient connected to face mask oxygen  Post-op Assessment: Report given to RN and Post -op Vital signs reviewed and stable  Post vital signs: Reviewed and stable  Last Vitals:  Vitals:   07/24/16 1154  BP: 119/87  Pulse: 97  Resp: 14  Temp: 36.2 C    Last Pain:  Vitals:   07/24/16 1154  TempSrc: Tympanic         Complications: No apparent anesthesia complications

## 2016-07-24 NOTE — H&P (Signed)
07/24/2016 1:03 PM   Todd Hobbs November 11, 1960 [id number]  CC: Bladder tumor  HPI: The patient is a 56 year old gentleman presents today for completion of his microscopic hematuria workup. His CT urogram was unremarkable. 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  Physical family history of kidney and bladder cancer in his father. He is a smoker. 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      PMH: Past Medical History:  Diagnosis Date  . Anxiety   . Arthritis    ankles  . Bipolar 1 disorder ([place])   . COPD (chronic obstructive pulmonary disease) ([place])   . Depression   . Gout   . History of shingles   . HLD (hyperlipidemia)   . Knee pain, right    wears brace  . Pulmonary fibrosis ([facility])   . Vertigo    last episode approx 1/17    Surgical History: Past Surgical History:  Procedure Laterality Date  . CATARACT EXTRACTION W/ INTRAOCULAR LENS  IMPLANT, BILATERAL    . COLONOSCOPY    . EXCISION PARTIAL PHALANX Right 06/08/2015   Procedure: EXCISION BONE PHALANX RIGHTT AND LEFTT 5TH TOES---Resection of proximal phalanx head and the lateral portion the middle distal phalanges right fifth toe Resection of proximal phalanx head and lateral portion of the middle and distal phalanges left fifth toe ;  Surgeon: Albertine Patricia, DPM;  Location: [facility];  Service: Podiatry;  Laterality: Right;  LOCAL WITH IVA  . FOOT SURGERY     subtalar fusion Dr Madison Hickman  . TONSILLECTOMY     and adenoidectomy  . WISDOM TOOTH EXTRACTION       Allergies: No Known Allergies  Family History: Family History  Problem Relation Age of Onset  . Kidney cancer Father   . Melanoma Father   . Bladder Cancer Father   . Lung cancer Father   . Hematuria Father   . Prostate cancer Brother     Social History:  reports that he has been smoking Cigarettes.  He has a 33.00 pack-year smoking history.  He has never used smokeless tobacco. He reports that he drinks about 1.2 oz of alcohol per week . He reports that he uses drugs, including Marijuana.  ROS: 12 point ROS negative except for above  Physical Exam: BP 119/87   Pulse 97   Temp 97.1 F (36.2 C) (Tympanic)   Resp 14   Ht 5\' 4"  (1.626 m)   Wt 184 lb (83.5 kg)   SpO2 98%   BMI 31.58 kg/m   Constitutional:  Alert and oriented, No acute distress. HEENT: [place] AT, moist mucus membranes.  Trachea midline, no masses. Cardiovascular: No clubbing, cyanosis, or edema. RRR Respiratory: Normal respiratory effort, no increased work of breathing. Lungs clear GI: Abdomen is soft, nontender, nondistended, no abdominal masses GU: No CVA tenderness.  Skin: No rashes, bruises or suspicious lesions. Lymph: No cervical or inguinal adenopathy. Neurologic: Grossly intact, no focal deficits, moving all 4 extremities. Psychiatric: Normal mood and affect.  Laboratory Data: No results found for: WBC, HGB, HCT, MCV, PLT  Lab Results  Component Value Date   CREATININE 0.90 06/20/2016    No results found for: PSA  No results found for: TESTOSTERONE  No results found for: HGBA1C  Urinalysis    Component Value Date/Time   APPEARANCEUR Clear 07/18/2016 1541   GLUCOSEU Negative 07/18/2016 1541   BILIRUBINUR Negative 07/18/2016  [place] Negative 07/18/2016 1541   NITRITE Negative 07/18/2016 1541   LEUKOCYTESUR Negative 07/18/2016 1541    Assessment & Plan:    1. Bladder tumor I discussed with the patient that it is small lesion in the posterior bladder wall of his bladder that is concerning for an early malignancy. We discussed the next step would be cystoscopy, bladder biopsy versus transurethral resection of bladder tumor. I discussed the risks, benefits, and indications of this procedure. He understands the risks include but are not limited to bleeding, infection, iatrogenic injury, need for Foley catheter, need for repeat  procedures. All questions were answered. The patient is agreeable to proceeding.   2. Microscopic hematuria -2/2 above  3. Prostate cancer screening Up to date  Nickie Retort, [facility] [address] [place], [place] [address] [phone]

## 2016-07-24 NOTE — Op Note (Signed)
Date of procedure: 07/24/16  Preoperative diagnosis:  1. Bladder tumor   Postoperative diagnosis:  1. Bladder tumor   Procedure: 1. Transurethral resection of bladder tumor-2 cm 2. 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 A 2 cm strip of this are was resected with the resectoscope and sent to pathology. The remainder of the lesion was fulgurated. There were no exophytic lesions within the bladder.  EBL: None  Specimens: Bladder tumor to pathology  Drains: None  Disposition: Stable to the postanesthesia care unit  Indication for procedure: The patient is a 56 y.o. male with the erythematous bladder lesion on the posterior bladder wall that was found during a microscopic hematuria workup who presents today for resection.  After reviewing the management options for treatment, the patient elected to proceed with the above surgical procedure(s). We have discussed the potential benefits and risks of the procedure, side effects of the proposed treatment, the likelihood of the patient achieving the goals of the procedure, and any potential problems that might occur during the procedure or recuperation. Informed consent has been obtained.  Description of procedure: The patient was met in the preoperative area. All risks, benefits, and indications of the procedure were described in great detail. The patient consented to the procedure. Preoperative antibiotics were given. The patient was taken to the operative theater. General anesthesia was induced per the anesthesia service. The patient was then placed in the dorsal lithotomy position and prepped and draped in the usual sterile fashion. A preoperative timeout was called.   A 24 French resectoscope sheath with visual obturator was inserted into the  patient's bladder per urethra atraumatically. Pan cystoscopy revealed the previously seen erythematous area in the posterior bladder wall. There is no exophytic lesions. 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 A 2 cm strip of it was resected with the resectoscope and sent to pathology. The resection site was followed until hemostasis was excellent. The remainder of the concerning 5 cm patch was fulgurated. The remainder of the cystoscopic evaluation was normal. At this point, the patient's bladder was drained and was awoken from anesthesia and transferred in stable condition to the postanesthesia care unit.   Plan: The patient will follow-up in one week to discuss pathology results.  Baruch Gouty, M.D.

## 2016-07-24 NOTE — Progress Notes (Signed)
Voided x1  Somewhat bloody   States wants to go home

## 2016-07-24 NOTE — Discharge Instructions (Signed)

## 2016-07-25 ENCOUNTER — Encounter: Payer: Self-pay | Admitting: Urology

## 2016-07-25 LAB — SURGICAL PATHOLOGY

## 2016-07-29 NOTE — Anesthesia Postprocedure Evaluation (Signed)
Anesthesia Post Note  Patient: Todd Hobbs  Procedure(s) Performed: Procedure(s) (LRB): TRANSURETHRAL RESECTION OF BLADDER TUMOR (TURBT) (SMALL) (N/A)  Patient location during evaluation: PACU Anesthesia Type: General Level of consciousness: awake Pain management: pain level controlled Respiratory status: spontaneous breathing Cardiovascular status: stable Anesthetic complications: no     Last Vitals:  Vitals:   07/24/16 1456 07/24/16 1539  BP: 128/85 (!) 129/94  Pulse: 82 78  Resp: 18   Temp: (!) 35.8 C     Last Pain:  Vitals:   07/25/16 0836  TempSrc:   PainSc: 0-No pain                 VAN STAVEREN,Deetra Booton

## 2016-08-01 ENCOUNTER — Ambulatory Visit (INDEPENDENT_AMBULATORY_CARE_PROVIDER_SITE_OTHER): Payer: Managed Care, Other (non HMO) | Admitting: Urology

## 2016-08-01 ENCOUNTER — Encounter: Payer: Self-pay | Admitting: Urology

## 2016-08-01 VITALS — BP 123/82 | HR 112 | Ht 64.0 in | Wt 183.9 lb

## 2016-08-01 DIAGNOSIS — R3129 Other microscopic hematuria: Secondary | ICD-10-CM | POA: Diagnosis not present

## 2016-08-01 NOTE — Progress Notes (Signed)
08/01/2016 11:48 AM   Todd Hobbs 1961-02-02 [id number]  Referring provider: Kirk Ruths, MD [facility] [place], [place] [address]  Chief Complaint  Patient presents with  . Follow-up    Path results    HPI: The patient is a 56 year old gentleman presents today for completion of his microscopic hematuria workup. His CT urogram was unremarkable. 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 He underwent bladder biopsy/TURBT in June 2018 which was negative for malignancy.  Family history of kidney and bladder cancer in his father. He is a smoker. 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        PMH: Past Medical History:  Diagnosis Date  . Anxiety   . Arthritis    ankles  . Bipolar 1 disorder ([facility])   . COPD (chronic obstructive pulmonary disease) ([place])   . Depression   . Gout   . History of shingles   . HLD (hyperlipidemia)   . Knee pain, right    wears brace  . Pulmonary fibrosis ([place])   . Vertigo    last episode approx 1/17    Surgical History: Past Surgical History:  Procedure Laterality Date  . CATARACT EXTRACTION W/ INTRAOCULAR LENS  IMPLANT, BILATERAL    . COLONOSCOPY    . EXCISION PARTIAL PHALANX Right 06/08/2015   Procedure: EXCISION BONE PHALANX RIGHTT AND LEFTT 5TH TOES---Resection of proximal phalanx head and the lateral portion the middle distal phalanges right fifth toe Resection of proximal phalanx head and lateral portion of the middle and distal phalanges left fifth toe ;  Surgeon: Albertine Patricia, DPM;  Location: [facility];  Service: Podiatry;  Laterality: Right;  LOCAL WITH IVA  . FOOT SURGERY     subtalar fusion Dr Madison Hickman  . TONSILLECTOMY     and adenoidectomy  . TRANSURETHRAL RESECTION OF BLADDER TUMOR N/A 07/24/2016   Procedure: TRANSURETHRAL RESECTION OF BLADDER TUMOR (TURBT) (SMALL);  Surgeon:  Nickie Retort, MD;  Location: [facility];  Service: Urology;  Laterality: N/A;  . WISDOM TOOTH EXTRACTION      Home Medications:  Allergies as of 08/01/2016   No Known Allergies     Medication List       Accurate as of 08/01/16 11:48 AM. Always use your most recent med list.          albuterol 108 (90 Base) MCG/ACT inhaler Commonly known as:  PROVENTIL HFA;VENTOLIN HFA Inhale 2 puffs into the lungs every 6 (six) hours as needed for wheezing or shortness of breath.   cephALEXin 500 MG capsule Commonly known as:  KEFLEX Take 1 capsule (500 mg total) by mouth 3 (three) times daily.   doxycycline 100 MG capsule Commonly known as:  VIBRAMYCIN Take 100 mg by mouth 2 (two) times daily.   HYDROcodone-acetaminophen 5-325 MG tablet Commonly known as:  NORCO Take 1-2 tablets by mouth every 4 (four) hours as needed for moderate pain.   ibuprofen 200 MG tablet Commonly known as:  ADVIL,MOTRIN Take 400 mg by mouth 2 (two) times daily as needed for mild pain.   lamoTRIgine 100 MG tablet Commonly known as:  LAMICTAL Take 100 mg by mouth daily.   traZODone 100 MG tablet Commonly known as:  DESYREL Take 100 mg by mouth at bedtime.       Allergies: No Known Allergies  Family History: Family History  Problem Relation Age of Onset  . Kidney cancer Father   . Melanoma Father   . Bladder Cancer Father   . Lung cancer Father   . Hematuria Father   . Prostate cancer Brother     Social History:  reports that he has been smoking Cigarettes.  He has a 33.00 pack-year smoking history. He has never used smokeless tobacco. He reports that he drinks about 1.2 oz of alcohol per week . He reports that he uses drugs, including Marijuana.  ROS: UROLOGY Frequent Urination?: No Hard to postpone urination?: No Burning/pain with urination?: No Get up at night to urinate?: No Leakage of urine?: No Urine stream starts and stops?: Yes Trouble starting stream?: No Do you have to strain  to urinate?: No Blood in urine?: No Urinary tract infection?: No Sexually transmitted disease?: No Injury to kidneys or bladder?: No Painful intercourse?: No Weak stream?: No Erection problems?: No Penile pain?: No  Gastrointestinal Nausea?: No Vomiting?: No Indigestion/heartburn?: No Diarrhea?: No Constipation?: No  Constitutional Fever: No Night sweats?: No Weight loss?: No Fatigue?: Yes  Skin Skin rash/lesions?: Yes Itching?: No  Eyes Blurred vision?: No Double vision?: No  Ears/Nose/Throat Sore throat?: No Sinus problems?: Yes  Hematologic/Lymphatic Swollen glands?: Yes Easy bruising?: No  Cardiovascular Leg swelling?: No Chest pain?: No  Respiratory Cough?: No Shortness of breath?: No  Endocrine Excessive thirst?: No  Musculoskeletal Back pain?: No Joint pain?: Yes  Neurological Headaches?: Yes Dizziness?: No  Psychologic Depression?: Yes Anxiety?: Yes  Physical Exam: BP 123/82 (BP Location: Left Arm, Patient Position: Sitting, Cuff Size: Normal)   Pulse (!) 112   Ht 5\' 4"  (1.626 m)   Wt 183 lb 14.4 oz (83.4 kg)   BMI 31.57 kg/m   Constitutional:  Alert and oriented, No acute distress. HEENT: [place] AT, moist mucus membranes.  Trachea midline, no masses. Cardiovascular: No clubbing, cyanosis, or edema. Respiratory: Normal respiratory effort, no increased work of breathing. GI: Abdomen is soft, nontender, nondistended, no abdominal masses GU: No CVA tenderness.  Skin: No rashes, bruises or suspicious lesions. Lymph: No cervical or inguinal adenopathy. Neurologic: Grossly intact, no focal deficits, moving all 4 extremities. Psychiatric: Normal mood and affect.  Laboratory Data: No results found for: WBC, HGB, HCT, MCV, PLT  Lab Results  Component Value Date   CREATININE 0.90 06/20/2016    No results found for: PSA  No results found for: TESTOSTERONE  No results found for: HGBA1C  Urinalysis    Component Value Date/Time    APPEARANCEUR Clear 07/18/2016 1541   GLUCOSEU Negative 07/18/2016 1541   BILIRUBINUR Negative 07/18/2016 1541   PROTEINUR Negative 07/18/2016 1541   NITRITE Negative 07/18/2016 1541   LEUKOCYTESUR Negative 07/18/2016 1541     Assessment & Plan:    1. Microscopic hematuria The patient was informed of his negative pathology results. No evidence of malignancy. The patient will follow-up in one year for repeat urinalysis.  Return in about 1 year (around 08/01/2017) for urinalysis.  Nickie Retort, MD  [facility] [address] [place], [place] [address] [phone]

## 2017-02-24 DIAGNOSIS — I700 Atherosclerosis of aorta: Secondary | ICD-10-CM | POA: Insufficient documentation

## 2017-07-31 ENCOUNTER — Other Ambulatory Visit: Payer: Managed Care, Other (non HMO)

## 2017-07-31 ENCOUNTER — Other Ambulatory Visit: Payer: Self-pay | Admitting: Family Medicine

## 2017-07-31 DIAGNOSIS — R3129 Other microscopic hematuria: Secondary | ICD-10-CM

## 2017-07-31 LAB — URINALYSIS, COMPLETE
Bilirubin, UA: NEGATIVE
Glucose, UA: NEGATIVE
Ketones, UA: NEGATIVE
Leukocytes, UA: NEGATIVE
Nitrite, UA: NEGATIVE
Protein, UA: NEGATIVE
RBC, UA: NEGATIVE
Specific Gravity, UA: <1.005 — ABNORMAL LOW (ref 1.005–1.030)
Urobilinogen, Ur: 0.2 mg/dL (ref 0.2–1.0)
pH, UA: 6 (ref 5.0–7.5)

## 2017-07-31 LAB — MICROSCOPIC EXAMINATION
Bacteria, UA: NONE SEEN
Epithelial Cells (non renal): NONE SEEN /hpf (ref 0–10)
WBC, UA: NONE SEEN /hpf (ref 0–5)

## 2017-08-01 ENCOUNTER — Telehealth: Payer: Self-pay

## 2017-08-01 NOTE — Telephone Encounter (Signed)
-----   Message from Nori Riis, PA-C sent at 08/01/2017  8:00 AM EDT ----- Please let Mr. Mcguire know that his UA was negative for blood, but he was having frequency of urination and we need to address this issue.  Please have him make an appointment.

## 2017-08-01 NOTE — Telephone Encounter (Signed)
Called pt, no answer. LM for pt informing him of the information below. Advised pt to call back to schedule appt.

## 2017-08-07 ENCOUNTER — Telehealth: Payer: Self-pay | Admitting: Urology

## 2017-08-07 NOTE — Telephone Encounter (Signed)
Pt called and I read message.  He said he wasn't having any problems w/frequency.  [phone]  Please give pt a call.

## 2017-08-11 NOTE — Telephone Encounter (Signed)
lmom 

## 2017-09-09 ENCOUNTER — Other Ambulatory Visit: Payer: Self-pay | Admitting: Physical Medicine and Rehabilitation

## 2017-09-09 DIAGNOSIS — M542 Cervicalgia: Secondary | ICD-10-CM

## 2017-09-15 ENCOUNTER — Other Ambulatory Visit: Payer: Self-pay | Admitting: Physical Medicine and Rehabilitation

## 2017-09-15 DIAGNOSIS — M5030 Other cervical disc degeneration, unspecified cervical region: Secondary | ICD-10-CM

## 2017-09-15 DIAGNOSIS — M542 Cervicalgia: Secondary | ICD-10-CM

## 2017-09-15 DIAGNOSIS — M5412 Radiculopathy, cervical region: Secondary | ICD-10-CM

## 2017-09-20 ENCOUNTER — Ambulatory Visit: Payer: Managed Care, Other (non HMO)

## 2017-10-03 ENCOUNTER — Ambulatory Visit
Admission: RE | Admit: 2017-10-03 | Discharge: 2017-10-03 | Disposition: A | Discharge status: Home / Self Care | Payer: Managed Care, Other (non HMO) | Source: Ambulatory Visit | Attending: Physical Medicine and Rehabilitation | Admitting: Physical Medicine and Rehabilitation

## 2017-10-03 ENCOUNTER — Observation Stay
Admission: RE | Admit: 2017-10-03 | Discharge: 2017-10-03 | Disposition: A | Discharge status: Home / Self Care | Payer: Managed Care, Other (non HMO) | Source: Ambulatory Visit | Attending: Physical Medicine and Rehabilitation | Admitting: Physical Medicine and Rehabilitation

## 2017-10-03 DIAGNOSIS — M542 Cervicalgia: Secondary | ICD-10-CM

## 2017-10-03 DIAGNOSIS — M5030 Other cervical disc degeneration, unspecified cervical region: Secondary | ICD-10-CM

## 2017-10-03 DIAGNOSIS — M5412 Radiculopathy, cervical region: Secondary | ICD-10-CM

## 2017-10-03 DIAGNOSIS — M4802 Spinal stenosis, cervical region: Secondary | ICD-10-CM | POA: Diagnosis not present

## 2017-10-03 DIAGNOSIS — M50221 Other cervical disc displacement at C4-C5 level: Secondary | ICD-10-CM | POA: Diagnosis not present

## 2018-02-11 HISTORY — PX: CHEILECTOMY: SHX1336

## 2018-12-31 IMAGING — CT CT ABD-PEL WO/W CM
2 of 6 series · 13 of 32 positions shown, 18 images · IV contrast (iopamidol)
Comparison: 05/31/2016

CLINICAL DATA: Left flank and left groin pain, intermittent x2
months, with microscopic hematuria, history of BPH

EXAM:
CT ABDOMEN AND PELVIS WITHOUT AND WITH CONTRAST
TECHNIQUE: Multidetector CT imaging of the abdomen and pelvis was performed
following the standard protocol before and following the bolus
administration of intravenous contrast.
CONTRAST:  125mL [id number] IOPAMIDOL ([id number]) INJECTION 61%

[Series 2: axial pre · axial · non-contrast · 0.77mm/px · z∈[-995,-650]mm · 7 of 93 slices shown]
[im 12/93  soft-tissue]
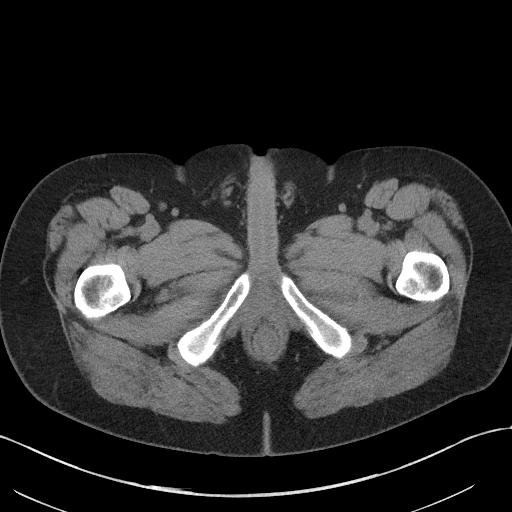
[im 24/93  soft-tissue]
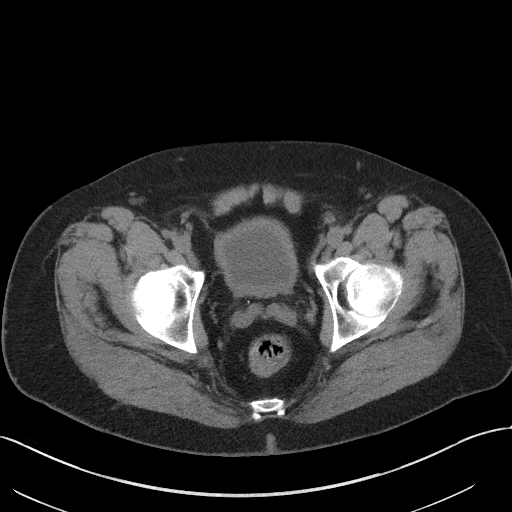
[im 35/93  soft-tissue]
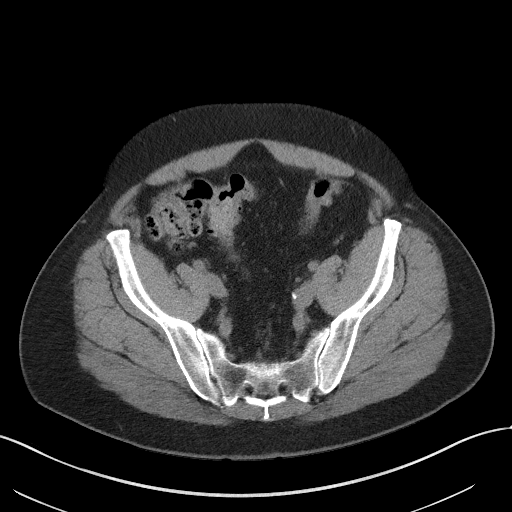
[im 47/93  soft-tissue]
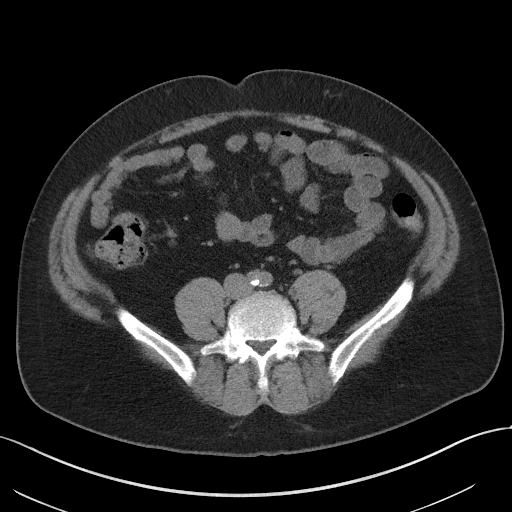
[im 58/93  soft-tissue]
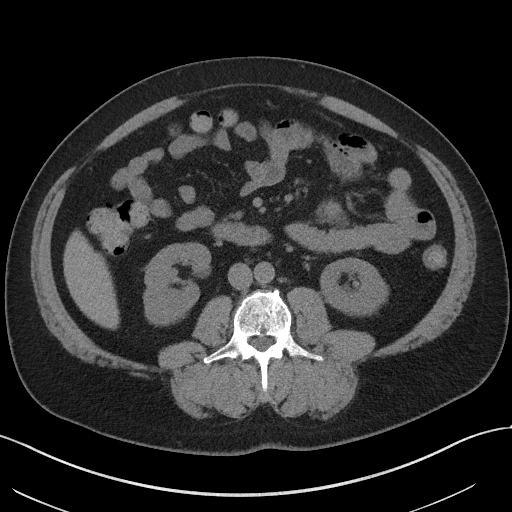
[im 70/93  soft-tissue]
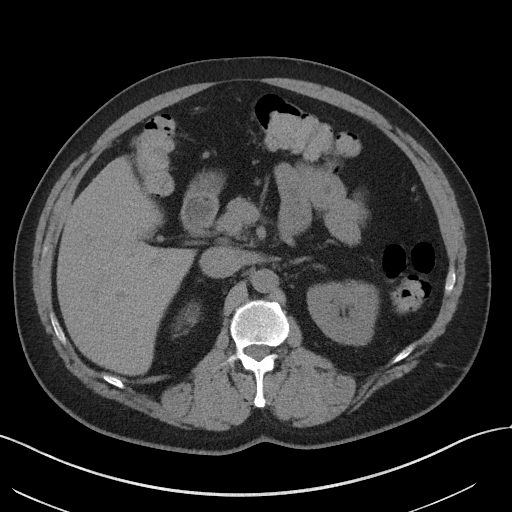
[im 81/93  soft-tissue]
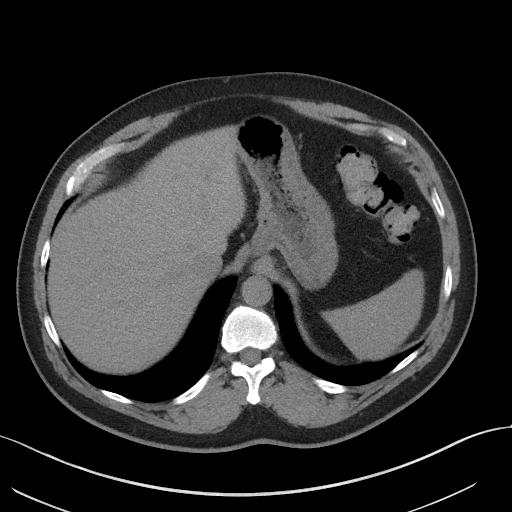

[Series 13: axial delay · axial · delayed · 0.72mm/px · z∈[-878,-538]mm · 6 of 96 slices shown, 11 images]
[im 14/96  soft-tissue]
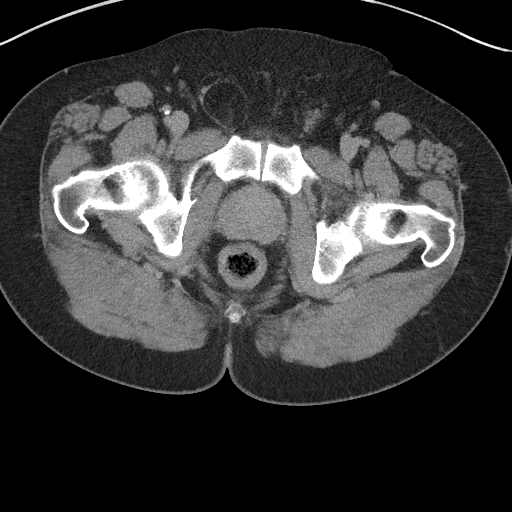
[im 14/96  bone]
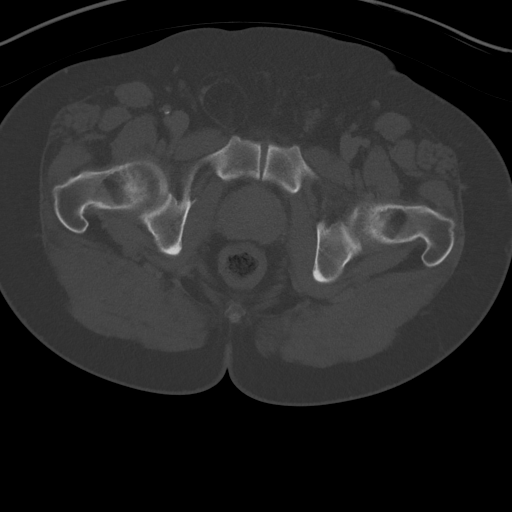
[im 28/96  soft-tissue]
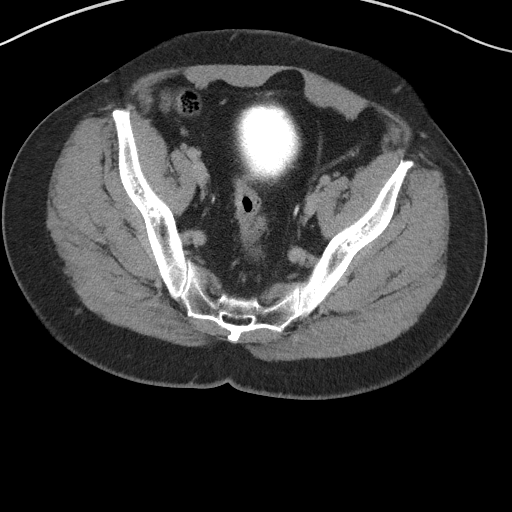
[im 41/96  soft-tissue]
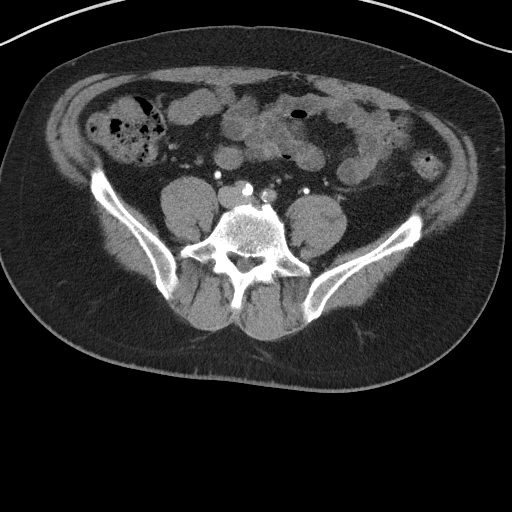
[im 41/96  lung]
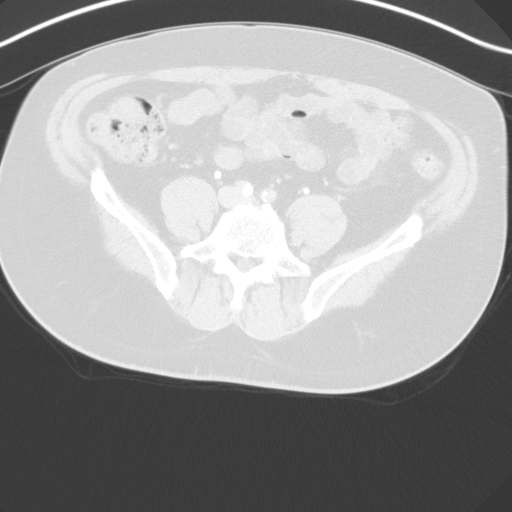
[im 55/96  soft-tissue]
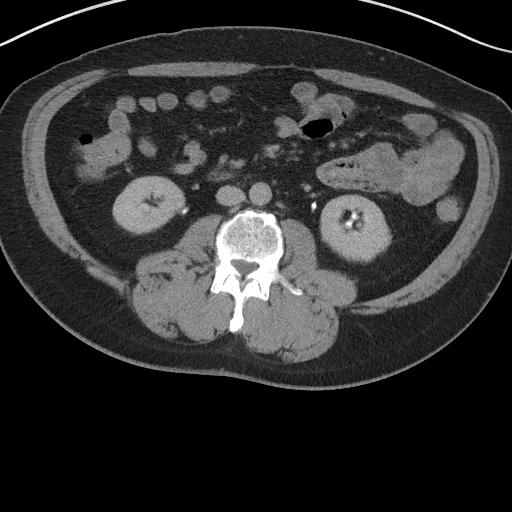
[im 55/96  lung]
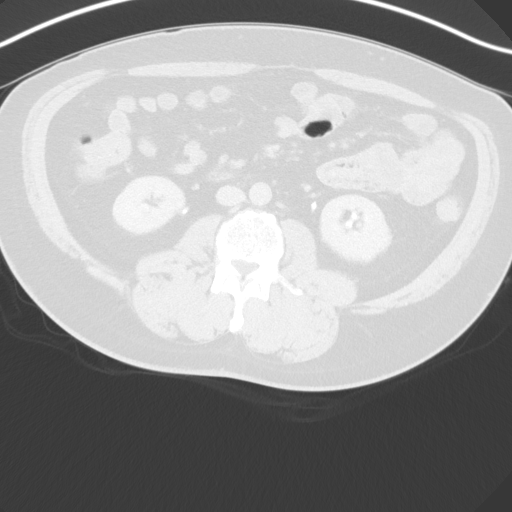
[im 68/96  soft-tissue]
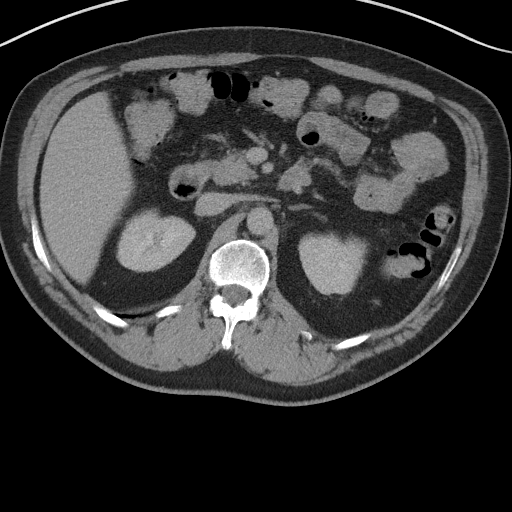
[im 68/96  lung]
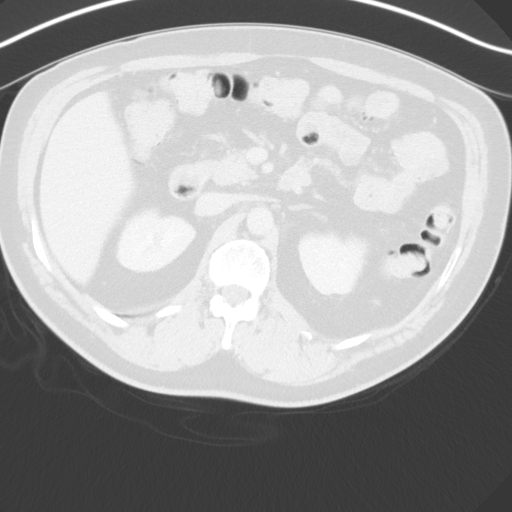
[im 82/96  soft-tissue]
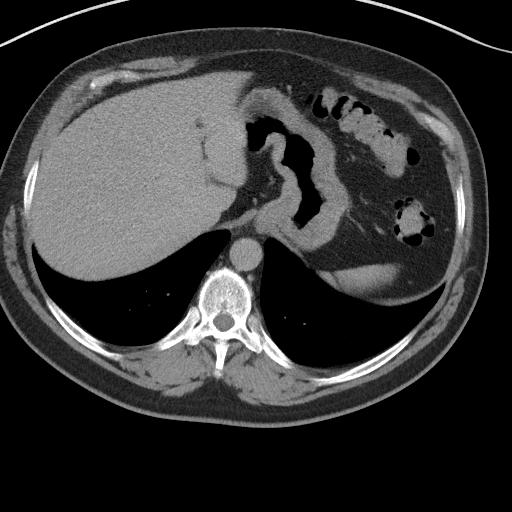
[im 82/96  lung]
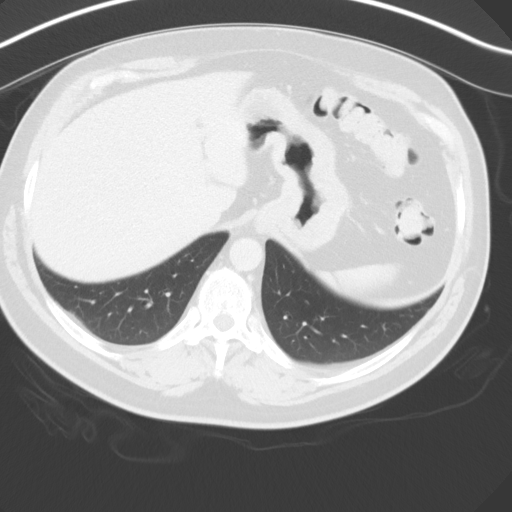

[13 of 32 positions shown; findings below may reference images not displayed]

FINDINGS: Lower chest: Lung bases are clear.

Hepatobiliary: Liver is within normal limits.

Gallbladder is notable for a subcentimeter gallstone (series 4/
image 27), without associated inflammatory changes.

Pancreas: Within normal limits.

Spleen: Within normal limits.

Adrenals/Urinary Tract: Adrenal glands are within normal limits.

Kidneys are within normal limits.  No enhancing renal lesions.

No renal, ureteral, or bladder calculi no hydronephrosis.

On delayed imaging, there are no filling defects in the bilateral
opacified proximal collecting systems, ureters, or bladder.

Bladder is within normal limits.

Stomach/Bowel: Stomach is within normal limits.

No evidence of bowel obstruction.

Vascular/Lymphatic: No evidence of abdominal aortic aneurysm.

Atherosclerotic calcifications of the abdominal aorta and branch
vessels.

No suspicious abdominopelvic lymphadenopathy.

Reproductive: Mild prostatomegaly.

Other: No abdominopelvic ascites.

Small fat containing right inguinal hernia.

Musculoskeletal: Very mild degenerative changes of the lumbar spine.
IMPRESSION: No CT findings to account for the patient's microhematuria.

Cholelithiasis, without associated inflammatory changes.

Small fat containing right inguinal hernia.

## 2020-04-11 DIAGNOSIS — H9311 Tinnitus, right ear: Secondary | ICD-10-CM | POA: Insufficient documentation

## 2022-04-30 ENCOUNTER — Encounter: Payer: Self-pay | Admitting: Emergency Medicine

## 2022-04-30 ENCOUNTER — Other Ambulatory Visit: Payer: Self-pay

## 2022-04-30 ENCOUNTER — Emergency Department
Admission: EM | Admit: 2022-04-30 | Discharge: 2022-04-30 | Disposition: A | Discharge status: Home / Self Care | Payer: BC Managed Care – PPO | Attending: Emergency Medicine | Admitting: Emergency Medicine

## 2022-04-30 DIAGNOSIS — F411 Generalized anxiety disorder: Secondary | ICD-10-CM | POA: Diagnosis not present

## 2022-04-30 DIAGNOSIS — F418 Other specified anxiety disorders: Secondary | ICD-10-CM | POA: Insufficient documentation

## 2022-04-30 DIAGNOSIS — F1090 Alcohol use, unspecified, uncomplicated: Secondary | ICD-10-CM | POA: Diagnosis not present

## 2022-04-30 DIAGNOSIS — F419 Anxiety disorder, unspecified: Secondary | ICD-10-CM

## 2022-04-30 DIAGNOSIS — F17210 Nicotine dependence, cigarettes, uncomplicated: Secondary | ICD-10-CM | POA: Diagnosis not present

## 2022-04-30 DIAGNOSIS — F1290 Cannabis use, unspecified, uncomplicated: Secondary | ICD-10-CM | POA: Diagnosis not present

## 2022-04-30 LAB — COMPREHENSIVE METABOLIC PANEL
ALT: 28 U/L (ref 0–44)
AST: 28 U/L (ref 15–41)
Albumin: 4 g/dL (ref 3.5–5.0)
Alkaline Phosphatase: 106 U/L (ref 38–126)
Anion gap: 12 (ref 5–15)
BUN: 13 mg/dL (ref 6–20)
CO2: 22 mmol/L (ref 22–32)
Calcium: 8.8 mg/dL — ABNORMAL LOW (ref 8.9–10.3)
Chloride: 103 mmol/L (ref 98–111)
Creatinine, Ser: 0.92 mg/dL (ref 0.61–1.24)
GFR, Estimated: >60 mL/min (ref >60)
Glucose, Bld: 192 mg/dL — ABNORMAL HIGH (ref 70–99)
Potassium: 3.8 mmol/L (ref 3.5–5.1)
Sodium: 137 mmol/L (ref 135–145)
Total Bilirubin: 1.1 mg/dL (ref 0.3–1.2)
Total Protein: 7.1 g/dL (ref 6.5–8.1)

## 2022-04-30 LAB — CBC
HCT: 47.6 % (ref 39.0–52.0)
Hemoglobin: 15.9 g/dL (ref 13.0–17.0)
MCH: 29.9 pg (ref 26.0–34.0)
MCHC: 33.4 g/dL (ref 30.0–36.0)
MCV: 89.5 fL (ref 80.0–100.0)
Platelets: 340 10*3/uL (ref 150–400)
RBC: 5.32 MIL/uL (ref 4.22–5.81)
RDW: 12.7 % (ref 11.5–15.5)
WBC: 9.7 10*3/uL (ref 4.0–10.5)
nRBC: 0 % (ref 0.0–0.2)

## 2022-04-30 LAB — ETHANOL: Alcohol, Ethyl (B): <10 mg/dL (ref <10)

## 2022-04-30 LAB — SALICYLATE LEVEL: Salicylate Lvl: <7 mg/dL — ABNORMAL LOW (ref 7.0–30.0)

## 2022-04-30 LAB — ACETAMINOPHEN LEVEL: Acetaminophen (Tylenol), Serum: <10 ug/mL — ABNORMAL LOW (ref 10–30)

## 2022-04-30 NOTE — ED Notes (Addendum)
Pt reports hx of anxiety, depression, Bipolar 1; states regularly takes trazodone, Lamictal and a cholesterol med. Denies major events that have recently stressed him out; states has been a slow build to heightened anxiety. Denies SI/HI currently. Pt steady, resp reg/unlabored, skin dry. Upon arrival to [facility] pt went straight to restroom and left urine specimen cup with NT who brought him back. Pt reminded when he left bathroom for room 20 that a urine specimen is needed next him he can produce one. Pt offered food and drink; pt declined. States sometimes anxiety is heightened by bright lights or other stimulation. Offered to dim pt's room lights; pt declined.

## 2022-04-30 NOTE — ED Notes (Signed)
Psych team at bedside .

## 2022-04-30 NOTE — ED Notes (Signed)
Psych team left bedside. Pt remains calm and cooperative.

## 2022-04-30 NOTE — ED Notes (Signed)
Pt changing into personal clothes.

## 2022-04-30 NOTE — BH Assessment (Signed)
Comprehensive Clinical Assessment (CCA) Screening, Triage and Referral Note  04/30/2022 BELTON ALTAMIRA [id number]  Delphina Cahill, 62 year old male who presents to [facility] ED voluntarily for treatment. Per triage note, Pt to ED via POV. Pt states that last week he had a severe anxiety attack. Pt states that he has a hx/o anxiety and Bipolar, pt states that he is taking his medications. Pt reports that since last week when he had the attack he is having trouble sleeping and eating. Pt states that he is contently having conversations with himself. Pt denies thoughts of hurting himself. Pt denies hallucinations.   During TTS assessment pt presents alert and oriented x 4, restless but cooperative, and mood-congruent with affect. The pt does not appear to be responding to internal or external stimuli. Neither is the pt presenting with any delusional thinking. Pt verified the information provided to triage RN.   Pt identifies his main complaint to be that he has anxiety and his symptoms have worsened. Upon evaluation, patient is extremely anxious. Patient is rambling and talking about past traumas that occurred in 2001. Patient apologized for not staying on task. Patient reports he experienced heartbreak from his partner in 2019 which led him to seek counseling. Patient reports he was seeing a therapist and psychiatrist, who diagnosed him with bipolar 1 disorder. Patient states he was taking psych medications regularly but discontinued using them. Patient admits to using marijuana daily as a means of relaxation. Patient denied using any other illicit substances and states he stopped drinking alcohol 4-5 years ago. Pt reports the reason he came to the ED is because while at work, his co-workers played a joke "that went too far." Patient reports this caused an anxiety attack so much  that he was not able to go to work this past week. Patient states he has an appointment on 05/09/22 with a psychiatrist but feels he  needs to see someone sooner. Pt denies current SI/HI/AH/VH. Patient does not believe inpatient hospitalization would be beneficial as patient reports lights and people are triggers. "I prefer to be alone." Patient states he is ready to go home. Patient declined NP's offer to prescribe Seroquel; however, patient states he was given this prescription by his primary care provider before coming to the ED. "I already have it. I will just pick it up from the pharmacy when I leave." Patient contracts for safety.    Per Barbaraann Share, NP, pt does not appear to be an imminent threat to himself or others. Strongly encouraged patient to reach out to the psychiatrist that he has appointment with tomorrow and see if he can get a sooner appointment than 3/28.  Chief Complaint:  Chief Complaint  Patient presents with   Psychiatric Evaluation   Visit Diagnosis: Anxiety  Patient Reported Information How did you hear about [place]? Self  What Is the Reason for Your Visit/Call Today? Patient came to ED for worsened symptoms with anxiety.  How Long Has This Been Causing You Problems? 1 wk - 1 month  What Do You Feel Would Help You the Most Today? Treatment for Depression or other mood problem; Stress Management; Medication(s)   Have You Recently Had Any Thoughts About Hurting Yourself? No  Are You Planning to Commit Suicide/Harm Yourself At This time? No   Have you Recently Had Thoughts About [place]? No  Are You Planning to Harm Someone at This Time? No  Explanation: No data recorded  Have You Used Any Alcohol or Drugs in  the Past 24 Hours? Yes  How Long Ago Did You Use Drugs or Alcohol? No data recorded What Did You Use and How Much? Marijuana- unknown   Do You Currently Have a Therapist/Psychiatrist? No  Name of Therapist/Psychiatrist: No data recorded  Have You Been Recently Discharged From Any Office Practice or Programs? No  Explanation of Discharge From Practice/Program: No data  recorded   CCA Screening Triage Referral Assessment Type of Contact: Face-to-Face  Telemedicine Service Delivery:   Is this Initial or Reassessment?   Date Telepsych consult ordered in [facility]:    Time Telepsych consult ordered in [facility]:    Location of Assessment: [facility] ED  Provider Location: [facility] ED    Collateral Involvement: None provided   Does Patient Have a [place]? No data recorded Name and Contact of Legal Guardian: No data recorded If Minor and Not Living with Parent(s), Who has Custody? No data recorded Is CPS involved or ever been involved? Never  Is APS involved or ever been involved? Never   Patient Determined To Be At Risk for Harm To Self or Others Based on Review of Patient Reported Information or Presenting Complaint? No  Method: No Plan  Availability of Means: No data recorded Intent: No data recorded Notification Required: No data recorded Additional Information for Danger to Others Potential: No data recorded Additional Comments for Danger to Others Potential: No data recorded Are There Guns or Other Weapons in Your Home? No data recorded Types of Guns/Weapons: No data recorded Are These Weapons Safely Secured?                            No data recorded Who Could Verify You Are Able To Have These Secured: No data recorded Do You Have any Outstanding Charges, Pending Court Dates, Parole/Probation? No data recorded Contacted To Inform of Risk of Harm To Self or Others: No data recorded  Does Patient Present under Involuntary Commitment? No    [place] of Residence: [place]   Patient Currently Receiving the Following Services: Medication Management   Determination of Need: Emergent (2 hours)   Options For Referral: ED Visit; Outpatient Therapy; Medication Management   Discharge Disposition:     Eula Fried, Counselor, LCAS-A

## 2022-04-30 NOTE — ED Notes (Signed)
Pt given warm blanket.

## 2022-04-30 NOTE — ED Provider Notes (Addendum)
[facility] Provider Note    Event Date/Time   First MD Initiated Contact with Patient 04/30/22 1549     (approximate)   History   Psychiatric Evaluation   HPI  Todd Hobbs is a 62 y.o. male past medical history of bipolar 1, anxiety and depression who presents with anxiety.  Patient tells me that his anxiety has been extremely anxious for the last several days.  Seem to been precipitated by an event at work where his coworkers told him he was going to have more work to do and this made him extremely anxious.  He had a "blowout" at work has not been able to sleep since.  He feels very shaky and anxious.  He does use marijuana to self medicate but does not use any other drugs or alcohol.  Has not had alcohol in about 5 years.  He denies hallucinations or suicidality.  Currently lives alone.  Does have a psychiatrist in the past is on Lamictal for his bipolar disorder and is compliant with this.  Has not seen a psychiatrist since 2019.  No family in the area.     Past Medical History:  Diagnosis Date   Anxiety    Arthritis    ankles   Bipolar 1 disorder (HCC)    COPD (chronic obstructive pulmonary disease) (HCC)    Depression    Gout    History of shingles    HLD (hyperlipidemia)    Knee pain, right    wears brace   Pulmonary fibrosis (HCC)    Vertigo    last episode approx 1/17    Patient Active Problem List   Diagnosis Date Noted   Anxiety state 04/30/2022     Physical Exam  Triage Vital Signs: ED Triage Vitals [04/30/22 1537]  Enc Vitals Group     BP (!) 156/90     Pulse Rate 98     Resp 16     Temp 98.3 F (36.8 C)     Temp Source Oral     SpO2 99 %     Weight 150 lb (68 kg)     Height 5\' 4"  (1.626 m)     Head Circumference      Peak Flow      Pain Score 2     Pain Loc      Pain Edu?      Excl. in [place]?     Most recent vital signs: Vitals:   04/30/22 1537  BP: (!) 156/90  Pulse: 98  Resp: 16  Temp: 98.3 F (36.8 C)   SpO2: 99%     General: Awake, no distress.  CV:  Good peripheral perfusion.  Resp:  Normal effort.  Abd:  No distention.  Neuro:             Awake, Alert, Oriented x 3  Other:  Patient is anxious appearing, mildly tremulous,   ED Results / Procedures / Treatments  Labs (all labs ordered are listed, but only abnormal results are displayed) Labs Reviewed  COMPREHENSIVE METABOLIC PANEL - Abnormal; Notable for the following components:      Result Value   Glucose, Bld 192 (*)    Calcium 8.8 (*)    All other components within normal limits  SALICYLATE LEVEL - Abnormal; Notable for the following components:   Salicylate Lvl Q000111Q (*)    All other components within normal limits  ACETAMINOPHEN LEVEL - Abnormal; Notable for the following components:  Acetaminophen (Tylenol), Serum <10 (*)    All other components within normal limits  ETHANOL  CBC  URINE DRUG SCREEN, QUALITATIVE ([facility] ONLY)     EKG    RADIOLOGY    PROCEDURES:  Critical Care performed: No  Procedures    MEDICATIONS ORDERED IN ED: Medications - No data to display   IMPRESSION / MDM / [place] / ED COURSE  I reviewed the triage vital signs and the nursing notes.                              Patient's presentation is most consistent with exacerbation of chronic illness.  Differential diagnosis includes, but is not limited to,  bipolar disorder, generalized anxiety disorder, adjustment disorder, substance-induced mood disorder  Patient is a 62 year old male with history of bipolar 1 and anxiety and depression who presents with worsening anxiety.  Patient has been quite anxious since last week when he had a panic attack at work.  Has not been able to sleep since and has been poorly functional.  Does have bipolar disorder and is on Lamictal is compliant.  Uses marijuana but no other drugs or alcohol.  Vital signs are notable for mild hypertension otherwise within normal limits.  Patient does  appear quite anxious his speech is somewhat pressured and thoughts are racing but he does have good insight.  Denies hallucinations says he frequently talks to himself but this is because he lives alone and is somewhat of a coping mechanism.  He is not suicidal currently.  Do not feel that he needs IVC at this time and he does want help.  Will consult psychiatry.  I offered patient p.o. benzo for anxiety but he preferred not to be medicated until he could see psychiatry.  Patient not currently under IVC.  Patient was seen by psychiatry and they do not feel that he needs admission and patient does not want admission.  I agree that he does not need IVC at this time.  Will discharge.       FINAL CLINICAL IMPRESSION(S) / ED DIAGNOSES   Final diagnoses:  Anxiety     Rx / DC Orders   ED Discharge Orders     None        Note:  This document was prepared using Dragon voice recognition software and may include unintentional dictation errors.   Rada Hay, MD 04/30/22 1607    Rada Hay, MD 04/30/22 Vernelle Emerald

## 2022-04-30 NOTE — ED Notes (Signed)
EDP McHugh to bedside.

## 2022-04-30 NOTE — Consult Note (Signed)
[place] Psychiatry Consult   Reason for Consult:  Anxiety Referring Physician:  Starleen Blue Patient Identification: Todd Hobbs MRN:  [id number] Principal Diagnosis: Anxiety state Diagnosis:  Principal Problem:   Anxiety state   Total Time spent with patient: 45 minutes  Subjective:"I have anxiety."    Todd Hobbs is a 62 y.o. male patient admitted with Anxiety.  HPI: Patient seen and chart reviewed. Patient presents to ED with anxiety.  On evaluation, patient presents as anxious, talkative. He states that talking helps him. He gets tangential, bringing up past trauma experiences that happened 20 years ago. He reports that he had therapy at that time and again in 2019 "when my heart was broken." He reports having seen a psychiatrist in 2019 who diagnosed him with bipolar disorder, but he does not take the Seroquel or Lamictal that he was prescribed. Patient reports that his primary provider called in Seroquel today, before patient came to the ED.   When asked what brings patient to the ED today, he states that he became very anxious last week after people at work "went too far" with a joke that he was not privy to it being a joke. He states that it really affected him.  He says that he was so anxious that he took this week off. He has an appointment with a psychiatrist on 3/18 and he has been trying to call them to get the appointment moved sooner. Patient works as a Conservation officer, nature.   Patient Denies suicidal thought or intent. Denies homicidal ideation, or hallucinations. Perceptions appear to be intact. Patient denies ever having any suicide attempts. Denies paranoia. He is anxious, but coherent, speaks in linear sentences, rational. When asked if he feels he needs to be hospitalized he says, "No, I do not think I would do  well in a hospital." He reports feeling better when he is alone, away from light and noise. Patient reports that he sleeps well, with normal appetite.   Denies alcohol use, reporting that he stopped alcohol use 4-5 years ago. Admits to daily marijuana use, saying he uses it "to relax." Denies other illicit drug use. BAL <10. UDS not available at time of evaluation.   Patient reports that he just wants to go home now. He declined writer's offer to prescribe some Seroquel while he is here. Patient states he will go to the pharmacy and pick it up, as his PCP called it in.   Patient does not appear to be an imminent threat to himself or others. Strongly encouraged patient to reach out to the psychiatrist that he has appointment with tomorrow and see if he can get a sooner appointment than 3/18.    Past Psychiatric History: Anxiety;bipolar disorder  Risk to Self:   Risk to Others:   Prior Inpatient Therapy:   Prior Outpatient Therapy:    Past Medical History:  Past Medical History:  Diagnosis Date   Anxiety    Arthritis    ankles   Bipolar 1 disorder (HCC)    COPD (chronic obstructive pulmonary disease) (HCC)    Depression    Gout    History of shingles    HLD (hyperlipidemia)    Knee pain, right    wears brace   Pulmonary fibrosis (HCC)    Vertigo    last episode approx 1/17    Past Surgical History:  Procedure Laterality Date   CATARACT EXTRACTION W/ INTRAOCULAR LENS  IMPLANT, BILATERAL     COLONOSCOPY  EXCISION PARTIAL PHALANX Right 06/08/2015   Procedure: EXCISION BONE PHALANX RIGHTT AND LEFTT 5TH TOES---Resection of proximal phalanx head and the lateral portion the middle distal phalanges right fifth toe Resection of proximal phalanx head and lateral portion of the middle and distal phalanges left fifth toe  ;  Surgeon: Albertine Patricia, DPM;  Location: [place];  Service: Podiatry;  Laterality: Right;  LOCAL WITH IVA   FOOT SURGERY     subtalar fusion Dr Madison Hickman   TONSILLECTOMY     and adenoidectomy   TRANSURETHRAL RESECTION OF BLADDER TUMOR N/A 07/24/2016   Procedure: TRANSURETHRAL RESECTION OF BLADDER TUMOR  (TURBT) (SMALL);  Surgeon: Nickie Retort, MD;  Location: [facility];  Service: Urology;  Laterality: N/A;   WISDOM TOOTH EXTRACTION     Family History:  Family History  Problem Relation Age of Onset   Kidney cancer Father    Melanoma Father    Bladder Cancer Father    Lung cancer Father    Hematuria Father    Prostate cancer Brother    Family Psychiatric  History:  Social History:  Social History   Substance and Sexual Activity  Alcohol Use Yes   Alcohol/week: 2.0 standard drinks of alcohol   Types: 2 Shots of liquor per week     Social History   Substance and Sexual Activity  Drug Use Yes   Types: Marijuana    Social History   Socioeconomic History   Marital status: Divorced    Spouse name: Not on file   Number of children: Not on file   Years of education: Not on file   Highest education level: Not on file  Occupational History   Not on file  Tobacco Use   Smoking status: Every Day    Packs/day: 1.00    Years: 33.00    Additional pack years: 0.00    Total pack years: 33.00    Types: Cigarettes   Smokeless tobacco: Never  Vaping Use   Vaping Use: Never used  Substance and Sexual Activity   Alcohol use: Yes    Alcohol/week: 2.0 standard drinks of alcohol    Types: 2 Shots of liquor per week   Drug use: Yes    Types: Marijuana   Sexual activity: Not on file  Other Topics Concern   Not on file  Social History Narrative   Not on file   Social Determinants of Health   Financial Resource Strain: Not on file  Food Insecurity: Not on file  Transportation Needs: Not on file  Physical Activity: Not on file  Stress: Not on file  Social Connections: Not on file   Additional Social History:    Allergies:  No Known Allergies  Labs:  Results for orders placed or performed during the hospital encounter of 04/30/22 (from the past 48 hour(s))  Comprehensive metabolic panel     Status: Abnormal   Collection Time: 04/30/22  3:39 PM  Result Value Ref  Range   Sodium 137 135 - 145 mmol/L   Potassium 3.8 3.5 - 5.1 mmol/L   Chloride 103 98 - 111 mmol/L   CO2 22 22 - 32 mmol/L   Glucose, Bld 192 (H) 70 - 99 mg/dL    Comment: Glucose reference range applies only to samples taken after fasting for at least 8 hours.   BUN 13 6 - 20 mg/dL   Creatinine, Ser 0.92 0.61 - 1.24 mg/dL   Calcium 8.8 (L) 8.9 - 10.3 mg/dL   Total  Protein 7.1 6.5 - 8.1 g/dL   Albumin 4.0 3.5 - 5.0 g/dL   AST 28 15 - 41 U/L   ALT 28 0 - 44 U/L   Alkaline Phosphatase 106 38 - 126 U/L   Total Bilirubin 1.1 0.3 - 1.2 mg/dL   GFR, Estimated >60 >60 mL/min    Comment: (NOTE) Calculated using the CKD-EPI Creatinine Equation (2021)    Anion gap 12 5 - 15    Comment: Performed at [facility], [address] [place], [place] [address]  Ethanol     Status: None   Collection Time: 04/30/22  3:39 PM  Result Value Ref Range   Alcohol, Ethyl (B) <10 <10 mg/dL    Comment: (NOTE) Lowest detectable limit for serum alcohol is 10 mg/dL.  For medical purposes only. Performed at [facility], [address] [place], [place] [id number]   Salicylate level     Status: Abnormal   Collection Time: 04/30/22  3:39 PM  Result Value Ref Range   Salicylate Lvl Q000111Q (L) 7.0 - 30.0 mg/dL    Comment: Performed at [facility], [address] [place], [place] [address]  Acetaminophen level     Status: Abnormal   Collection Time: 04/30/22  3:39 PM  Result Value Ref Range   Acetaminophen (Tylenol), Serum <10 (L) 10 - 30 ug/mL    Comment: (NOTE) Therapeutic concentrations vary significantly. A range of 10-30 ug/mL  may be an effective concentration for many patients. However, some  are best treated at concentrations outside of this range. Acetaminophen concentrations >150 ug/mL at 4 hours after ingestion  and >50 ug/mL at 12 hours after ingestion are often associated with  toxic reactions.  Performed at [facility], [address]  [place], [place] [address]   cbc     Status: None   Collection Time: 04/30/22  3:39 PM  Result Value Ref Range   WBC 9.7 4.0 - 10.5 K/uL   RBC 5.32 4.22 - 5.81 MIL/uL   Hemoglobin 15.9 13.0 - 17.0 g/dL   HCT 47.6 39.0 - 52.0 %   MCV 89.5 80.0 - 100.0 fL   MCH 29.9 26.0 - 34.0 pg   MCHC 33.4 30.0 - 36.0 g/dL   RDW 12.7 11.5 - 15.5 %   Platelets 340 150 - 400 K/uL   nRBC 0.0 0.0 - 0.2 %    Comment: Performed at [facility], [address] [place], [place] [address]    No current facility-administered medications for this encounter.   Current Outpatient Medications  Medication Sig Dispense Refill   atorvastatin (LIPITOR) 40 MG tablet Take 40 mg by mouth daily.     QUEtiapine (SEROQUEL) 50 MG tablet Take 50 mg by mouth at bedtime.     albuterol (PROVENTIL HFA;VENTOLIN HFA) 108 (90 Base) MCG/ACT inhaler Inhale 2 puffs into the lungs every 6 (six) hours as needed for wheezing or shortness of breath.      cephALEXin (KEFLEX) 500 MG capsule Take 1 capsule (500 mg total) by mouth 3 (three) times daily. 6 capsule 0   doxycycline (VIBRAMYCIN) 100 MG capsule Take 100 mg by mouth 2 (two) times daily.     HYDROcodone-acetaminophen (NORCO) 5-325 MG tablet Take 1-2 tablets by mouth every 4 (four) hours as needed for moderate pain. 30 tablet 0   ibuprofen (ADVIL,MOTRIN) 200 MG tablet Take 400 mg by mouth 2 (two) times daily as needed for mild pain.      lamoTRIgine (  LAMICTAL) 100 MG tablet Take 100 mg by mouth daily.      lamoTRIgine (LAMICTAL) 150 MG tablet Take 150 mg by mouth 2 (two) times daily.     traZODone (DESYREL) 100 MG tablet Take 100 mg by mouth at bedtime.      Musculoskeletal: Strength & Muscle Tone: within normal limits Gait & Station: normal Patient leans: N/A  Psychiatric Specialty Exam:  Presentation  General Appearance: Appropriate for Environment  Eye Contact:Good  Speech:Clear and Coherent  Speech Volume:Normal  Handedness:No data recorded  Mood and  Affect  Mood:Anxious  Affect:Congruent   Thought Process  Thought Processes:Coherent; Goal Directed; Linear  Descriptions of Associations:Intact  Orientation:Full (Time, Place and Person)  Thought Content:WDL; Logical  History of Schizophrenia/Schizoaffective disorder:No data recorded Duration of Psychotic Symptoms:No data recorded Hallucinations:Hallucinations: None  Ideas of Reference:None  Suicidal Thoughts:Suicidal Thoughts: No  Homicidal Thoughts:Homicidal Thoughts: No   Sensorium  Memory:Immediate Good; Recent Good; Remote Good  Judgment:Good  Insight:Good   Executive Functions  Concentration:Good  Attention Span:Good  Junction of Knowledge:Good  Language:Good   Psychomotor Activity  Psychomotor Activity:Psychomotor Activity: Increased   Assets  Assets:Communication Skills; Desire for Improvement; Financial Resources/Insurance; Housing; Resilience; Physical Health   Sleep  Sleep:Sleep: Good   Physical Exam: Physical Exam Vitals and nursing note reviewed.  HENT:     Head: Normocephalic.     Nose: No congestion or rhinorrhea.  Eyes:     General:        Right eye: No discharge.        Left eye: No discharge.  Cardiovascular:     Rate and Rhythm: Normal rate.  Pulmonary:     Effort: Pulmonary effort is normal.  Musculoskeletal:     Cervical back: Normal range of motion.  Neurological:     Mental Status: He is alert and oriented to person, place, and time.  Psychiatric:        Attention and Perception: Attention normal.        Mood and Affect: Mood is anxious.        Speech: Speech normal.        Behavior: Behavior is cooperative.        Thought Content: Thought content is not paranoid or delusional. Thought content does not include homicidal or suicidal ideation.        Cognition and Memory: Cognition normal.        Judgment: Judgment normal.    Review of Systems  Constitutional: Negative.   Respiratory: Negative.     Psychiatric/Behavioral:  Positive for depression (Hx of; stable). Negative for hallucinations, memory loss, substance abuse and suicidal ideas. The patient is nervous/anxious. The patient does not have insomnia.    Blood pressure (!) 156/90, pulse 98, temperature 98.3 F (36.8 C), temperature source Oral, resp. rate 16, height 5\' 4"  (1.626 m), weight 68 kg, SpO2 99 %. Body mass index is 25.75 kg/m.  Treatment Plan Summary: Patient does not present as an imminent danger to himself or others and will be discharged home to follow with his outpatient providers that he has made appointment with. He has declined Seroquel to take here, but says he will pick it up at the pharmacy. Reviewed with Dr. Starleen Blue  Disposition: No evidence of imminent risk to self or others at present.   Supportive therapy provided about ongoing stressors. Discussed crisis plan, support from social network, calling 911, coming to the Emergency Department, and calling Suicide Hotline.  Sherlon Handing, NP 04/30/2022 5:38  PM

## 2022-04-30 NOTE — ED Triage Notes (Signed)
Pt to ED via POV. Pt states that last week he had a severe anxiety attack. Pt states that he has a hx/o anxiety and Bipolar, pt states that he is taking his medications. Pt reports that since last week when he had the attack he is having trouble sleeping and eating. Pt states that he is contently having conversations with himself. Pt denies thoughts of hurting himself. Pt denies hallucinations.

## 2022-04-30 NOTE — ED Notes (Signed)
Patient dressed out by this RN and Marlane Mingle, EDT in hospital provided scrubs. Pt belongings placed in belongings bag and labeled with pt label and green tag. Pt had with them the following belongings:   1 pair of black shoes  1 pair of black socks 1 pair of gray pants  1 shirt blue short sleeve shirt 1 brown sweater 1 gray tank top 1 brown belt 1 pair of black boxer underwear 1 cell phone in red case 1 pair of keys 1 Wallet (Pt did not take wallet out of his pants)  Pt belongings taken with patient to quad. Pt calm and cooperative.

## 2022-04-30 NOTE — ED Notes (Signed)
Pt offered food and drink again; pt declined stating "I've got to go". Pt anxious; cooperative.

## 2022-04-30 NOTE — ED Notes (Signed)
Pt given cup of water; pt declined fresh dinner tray that just arrived to ER.
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
## 2022-05-09 ENCOUNTER — Encounter: Payer: Self-pay | Admitting: Psychiatry

## 2022-05-09 ENCOUNTER — Other Ambulatory Visit
Admission: RE | Admit: 2022-05-09 | Discharge: 2022-05-09 | Disposition: A | Discharge status: Home / Self Care | Payer: BC Managed Care – PPO | Source: Ambulatory Visit | Attending: Psychiatry | Admitting: Psychiatry

## 2022-05-09 ENCOUNTER — Ambulatory Visit: Payer: BC Managed Care – PPO | Admitting: Psychiatry

## 2022-05-09 ENCOUNTER — Telehealth: Payer: Self-pay | Admitting: Psychiatry

## 2022-05-09 VITALS — BP 132/83 | HR 98 | Temp 98.3°F | Wt 155.6 lb

## 2022-05-09 DIAGNOSIS — F0630 Mood disorder due to known physiological condition, unspecified: Secondary | ICD-10-CM

## 2022-05-09 DIAGNOSIS — F1290 Cannabis use, unspecified, uncomplicated: Secondary | ICD-10-CM

## 2022-05-09 DIAGNOSIS — F4310 Post-traumatic stress disorder, unspecified: Secondary | ICD-10-CM | POA: Diagnosis not present

## 2022-05-09 DIAGNOSIS — F4010 Social phobia, unspecified: Secondary | ICD-10-CM

## 2022-05-09 LAB — TSH: TSH: 1.596 u[IU]/mL (ref 0.350–4.500)

## 2022-05-09 MED ORDER — SERTRALINE HCL 50 MG PO TABS: ORAL_TABLET | ORAL | 1 refills | Status: DC

## 2022-05-09 NOTE — Telephone Encounter (Signed)
Spoke to patient informed him of the test results he voiced understanding

## 2022-05-09 NOTE — Telephone Encounter (Signed)
Please advise him that thyroid test came back normal.

## 2022-05-14 ENCOUNTER — Telehealth: Payer: Self-pay

## 2022-05-14 NOTE — Telephone Encounter (Signed)
faxed and confirmed the form for [employer]. basic questions submitted. per dr. Modesta Messing she did not take patient out of work and he also has not been established with our office for at least 6 months. form was put in the scan basket.

## 2022-05-15 ENCOUNTER — Ambulatory Visit (INDEPENDENT_AMBULATORY_CARE_PROVIDER_SITE_OTHER): Payer: BC Managed Care – PPO | Admitting: Behavioral Health

## 2022-05-15 ENCOUNTER — Encounter: Payer: Self-pay | Admitting: Behavioral Health

## 2022-05-15 DIAGNOSIS — F411 Generalized anxiety disorder: Secondary | ICD-10-CM

## 2022-05-15 DIAGNOSIS — F319 Bipolar disorder, unspecified: Secondary | ICD-10-CM | POA: Diagnosis not present

## 2022-05-15 DIAGNOSIS — F4010 Social phobia, unspecified: Secondary | ICD-10-CM

## 2022-05-15 DIAGNOSIS — F4310 Post-traumatic stress disorder, unspecified: Secondary | ICD-10-CM
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
## 2022-05-15 NOTE — Progress Notes (Signed)
                Kaspian Muccio M Kameren Pargas, LCMHC 

## 2022-05-19 ENCOUNTER — Other Ambulatory Visit: Payer: Self-pay | Admitting: Psychiatry

## 2022-05-29 ENCOUNTER — Ambulatory Visit (INDEPENDENT_AMBULATORY_CARE_PROVIDER_SITE_OTHER): Payer: BC Managed Care – PPO | Admitting: Behavioral Health

## 2022-05-29 ENCOUNTER — Encounter: Payer: Self-pay | Admitting: Behavioral Health

## 2022-05-29 DIAGNOSIS — F331 Major depressive disorder, recurrent, moderate: Secondary | ICD-10-CM

## 2022-05-29 DIAGNOSIS — F4010 Social phobia, unspecified: Secondary | ICD-10-CM

## 2022-05-29 DIAGNOSIS — F411 Generalized anxiety disorder: Secondary | ICD-10-CM

## 2022-05-29 NOTE — Progress Notes (Signed)
                Shantell Belongia M Amir Fick, LCMHC 

## 2022-05-29 NOTE — Progress Notes (Addendum)
[facility] Counselor/Therapist Progress Note  Patient ID: Todd Hobbs, MRN: [id number],    Date: 05/29/2022  Time Spent: 53 minutes via video session.  The patient was at home and this therapist was in his home office.  Treatment Type: Individual Therapy  Reported Symptoms: Anxiety, depression  Mental Status Exam: Appearance:  Well Groomed     Behavior: Appropriate  Motor: Normal  Speech/Language:  Normal Rate  Affect: Appropriate  Mood: anxious  Thought process: normal  Thought content:   WNL  Sensory/Perceptual disturbances:   WNL  Orientation: oriented to person, place, time/date, situation, day of week, and month of year  Attention: Good  Concentration: Good  Memory: WNL  Fund of knowledge:  Good  Insight:   Good  Judgment:  Good  Impulse Control: Good   Risk Assessment: Danger to Self:  No Self-injurious Behavior: No Danger to Others: No Duty to Warn:no Physical Aggression / Violence:No  Access to Firearms a concern: No  Gang Involvement:No   Subjective: The patient reports a decrease in anxiety over the past couple of weeks.  He has left his home and driven to a couple of places 1 of which was a grocery store.  He is careful to go at a time when he feels that it is not crowded which helps.   He said he always feels as if somebody is looking at him or judging him.  He does report an increase in depression over the past week but says he is not having any suicidal ideation.  He reports that he is medication compliant.  He knows that he is approved for short-term leave through the end of May but has been frustrated with the [employer] system which affects through his company short-term leave.  He said his primary care physician referred him to his psychiatrist for writing a letter and his psychiatrist says that she cannot do that without having seen him for 6 months.  He last saw her at the end of March and sees her sometime in May.  We talked more about  what caused the initial breakdown at work.  He says he is already anxious but if he can work by himself he does pretty well.  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  They told him that one of his peers who does the same job he does was going to be out for a while and there would be increased work load for the patient.  He said he started thinking about it and had a hard time getting past those thoughts.  His coworkers later told him they were joking with him but said at that point in time his anxiety had already progressed to panic.  Although his anxiety is better he still feels that he is not ready to go back to work because of his anxiety.  He works a split shift from 11 AM until 7 PM.  From 3:00 on is better but he has to be around people from 11-3 and he is concerned about that.  The fact that he got on the video visit and talks fairly comfortably was a big stretch for him which I praised him for.  We began to look at some things that might help in coping with his anxiety including a vagus nerve stimulation and a grounding exercise.  We talked about the importance of trying to keep his mind and hands busy to push back against  depression.  He does contract for safety having no thoughts of hurting himself or anyone else.  Interventions: Cognitive Behavioral Therapy  Diagnosis:G.A.D, with panic attacks, Social anxiety d/o  Plan: I will meet with the patient every 2 weeks via video session. Treatment plan: We will use cognitive behavioral therapy principles as well as elements of dialectical behavior therapy with a goal of reducing anxiety and depression by at least 50% with a target date of December 12, 2022.  Goals are to see the patient have less sadness as indicated by patient report and PHQ-9 scores, have improved mood and return to a healthier level of functioning, processed the causes for depressed mood and learn coping skills for depression.  Interventions will  be using cognitive behavioral therapy to explore and replace unhealthy thoughts and behavior patterns contributing to depression, provide education about depression to help him identify its causes and triggers, encouraged sharing of feelings related to per depression.  Goals for reducing anxiety are to improve his ability to manage anxiety symptoms and better handle stress, identify causes for anxiety and provide coping skills for lowering it and help him manage thoughts and worrisome thinking contributing to feelings of anxiety.  I will provide education about anxiety to help him see its causes, teach coping skills for managing anxiety, use CBT to identify and change anxiety producing thoughts and behavior patterns as well as use DBT distress tolerance and mindfulness skills. French Ana, [facility]

## 2022-05-30 ENCOUNTER — Telehealth: Payer: Self-pay

## 2022-05-30 NOTE — Telephone Encounter (Signed)
Pt.notified

## 2022-05-30 NOTE — Telephone Encounter (Signed)
I believe labs were drawn when he went to the ED. While it is influenced by his eating status (whether it was drawn fasting or not), it is safe to continue sertraline and lamotrigine at this time. I hope discontinuation of quetiapine will also help with his blood sugar issues.

## 2022-05-30 NOTE — Telephone Encounter (Signed)
Pt left a message that he did not know it was a different in the [facility]. He states that when he went onto [facility] he seen that he had labwork on march 19th and the glucose was high.  He wanted to find out if he needed to stop or make changes to his medications like the zoloft or the lamitcal.

## 2022-06-12 ENCOUNTER — Encounter (INDEPENDENT_AMBULATORY_CARE_PROVIDER_SITE_OTHER): Payer: BC Managed Care – PPO

## 2022-06-12 ENCOUNTER — Ambulatory Visit (INDEPENDENT_AMBULATORY_CARE_PROVIDER_SITE_OTHER): Payer: BC Managed Care – PPO | Admitting: Behavioral Health

## 2022-06-12 ENCOUNTER — Encounter: Payer: Self-pay | Admitting: Behavioral Health

## 2022-06-12 DIAGNOSIS — F331 Major depressive disorder, recurrent, moderate: Secondary | ICD-10-CM

## 2022-06-12 DIAGNOSIS — F4010 Social phobia, unspecified: Secondary | ICD-10-CM

## 2022-06-12 DIAGNOSIS — G4700 Insomnia, unspecified: Secondary | ICD-10-CM | POA: Diagnosis not present

## 2022-06-12 DIAGNOSIS — F411 Generalized anxiety disorder: Secondary | ICD-10-CM | POA: Diagnosis not present

## 2022-06-12 MED ORDER — TRAZODONE HCL 100 MG PO TABS: 100.0000 mg | ORAL_TABLET | Freq: Every evening | ORAL | 0 refills | Status: DC | PRN

## 2022-06-12 NOTE — Progress Notes (Addendum)
[facility] Counselor/Therapist Progress Note  Patient ID: SALIM ALONGE, MRN: [id number],    Date: 06/12/2022  Time Spent: 53 minutes via video session.  The patient was at home and this therapist was in his home office.  Treatment Type: Individual Therapy  Reported Symptoms: Anxiety, depression  Mental Status Exam: Appearance:  Well Groomed     Behavior: Appropriate  Motor: Normal  Speech/Language:  Normal Rate  Affect: Appropriate  Mood: anxious  Thought process: normal  Thought content:   WNL  Sensory/Perceptual disturbances:   WNL  Orientation: oriented to person, place, time/date, situation, day of week, and month of year  Attention: Good  Concentration: Good  Memory: WNL  Fund of knowledge:  Good  Insight:   Good  Judgment:  Good  Impulse Control: Good   Risk Assessment: Danger to Self:  No Self-injurious Behavior: No Danger to Others: No Duty to Warn:no Physical Aggression / Violence:No  Access to Firearms a concern: No  Gang Involvement:No   Subjective: The patient has not been sleeping well.  2 nights ago he did not sleep at all and last night he might have gotten 2 hours.  He took Seroquel around 4:00 yesterday afternoon and it took a while to get to sleep and even during the session at 2:00 he was still somewhat groggy but was able to complete the session.  He denies any significant racing thoughts but says that he just cannot get to sleep.  He tries his bed, recliner, the couch.  He listens to music or audiobooks.  He is on 50 mg of trazodone and one of the nights took 100 mg which helped some but still did not give him a full night's sleep.  He is limited in how many he can take because his prescription will run out before he meets his psychiatrist on May 16.  He has been taking them as prescribed.  He acknowledges a history of marijuana use to help him sleep but he has not had any in 6 weeks.  He still presents with significant anxiety about  the situation that happened at work.  He has a fear of going back there thinking that he will be judged.  He did run into a supervisor recently who told him to take his time and get well but the thought of being around those people even though he was friends with all of them still creates anxiety.  He acknowledges that he has always been very insecure which limits what he can do socially and publicly.  He can go into a store or restaurant but prefers to go when is not very crowded.  He cannot go through a drive through in a lot of cases because he has a lazy eye and says that affects his depth perception.  He does not feel that the Zoloft is making any difference in terms of mood elevation.  He does say the Lamictal is what made the difference in a positive way initially but he is not sure of the impact anymore.  He is currently on 300 mg/day.  He will speak to the doctor about that but I encouraged him to send her my chart message especially as there has been a decrease in sleep and an increase especially in hand tremors over the past few weeks.  He says it makes it hard to shave and he has dropped many things in the past 3 weeks.  He does say that chewing nicotine gum helps calm him  down some and his doctor told him that was okay as long as he was not smoking or babying and he is not.  He estimates that he chews 20 to 30 pieces/day but says that has become very expensive.  I reminded him of the breathing exercise and the grounding exercise as well as the vagus nerve stimulation.  We talked about the importance of positive sensory stimulation with her to be tactile olfactory auditory visual etc.  He feels that he definitely has some olfactory heightened awareness so we talked about diet and an using a  candle to light in the house during the day for calming purposes.  He has not listened to music much lately so I encouraged him to listen to his favorite group the Rolling Stones during the day some.  He does  listen to some music and/or audiobooks at night which helps him sleep also.  He has only met with his new psychiatrist once and did not feel an initial connection because he felt she was strictly professional with very little personal connection.  He wants to give it another shot and hopes that the meeting on 16th at least we will feel more comfortable.  He saw a psychiatrist in [place] for 5 years and felt comfortable with her but she retired when [employer] started. The patient does contract for safety having no thoughts of hurting himself or anyone else. Interventions: Cognitive Behavioral Therapy  Diagnosis:G.A.D, with panic attacks, Social anxiety d/o  Plan: I will meet with the patient every 2 weeks via video session. Treatment plan: We will use cognitive behavioral therapy principles as well as elements of dialectical behavior therapy with a goal of reducing anxiety and depression by at least 50% with a target date of December 12, 2022.  Goals are to see the patient have less sadness as indicated by patient report and PHQ-9 scores, have improved mood and return to a healthier level of functioning, processed the causes for depressed mood and learn coping skills for depression.  Interventions will be using cognitive behavioral therapy to explore and replace unhealthy thoughts and behavior patterns contributing to depression, provide education about depression to help him identify its causes and triggers, encouraged sharing of feelings related to per depression.  Goals for reducing anxiety are to improve his ability to manage anxiety symptoms and better handle stress, identify causes for anxiety and provide coping skills for lowering it and help him manage thoughts and worrisome thinking contributing to feelings of anxiety.  I will provide education about anxiety to help him see its causes, teach coping skills for managing anxiety, use CBT to identify and change anxiety producing thoughts and behavior  patterns as well as use DBT distress tolerance and mindfulness skills. Progress: 20%  French Ana, [facility]                 French Ana, [facility]

## 2022-06-12 NOTE — Telephone Encounter (Signed)
I've spent a total time of 7 minutes providing service to this patient-generated inquiry in the MyChart message  

## 2022-06-22 ENCOUNTER — Other Ambulatory Visit: Payer: Self-pay | Admitting: Psychiatry
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
## 2022-06-26 ENCOUNTER — Encounter: Payer: Self-pay | Admitting: Behavioral Health

## 2022-06-26 ENCOUNTER — Ambulatory Visit (INDEPENDENT_AMBULATORY_CARE_PROVIDER_SITE_OTHER): Payer: BC Managed Care – PPO | Admitting: Behavioral Health

## 2022-06-26 DIAGNOSIS — F411 Generalized anxiety disorder: Secondary | ICD-10-CM

## 2022-06-26 DIAGNOSIS — F4010 Social phobia, unspecified: Secondary | ICD-10-CM

## 2022-06-26 DIAGNOSIS — F331 Major depressive disorder, recurrent, moderate: Secondary | ICD-10-CM | POA: Diagnosis not present

## 2022-06-26 NOTE — Progress Notes (Addendum)
[facility] Counselor/Therapist Progress Note  Patient ID: Todd Hobbs, MRN: [id number],    Date: 06/26/2022  Time Spent: 56 minutes via video session.  The patient was at home and this therapist was in his home office.  Treatment Type: Individual Therapy  Reported Symptoms: Anxiety, depression  Mental Status Exam: Appearance:  Well Groomed     Behavior: Appropriate  Motor: Normal  Speech/Language:  Normal Rate  Affect: Appropriate  Mood: anxious  Thought process: normal  Thought content:   WNL  Sensory/Perceptual disturbances:   WNL  Orientation: oriented to person, place, time/date, situation, day of week, and month of year  Attention: Good  Concentration: Good  Memory: WNL  Fund of knowledge:  Good  Insight:   Good  Judgment:  Good  Impulse Control: Good   Risk Assessment: Danger to Self:  No Self-injurious Behavior: No Danger to Others: No Duty to Warn:no Physical Aggression / Violence:No  Access to Firearms a concern: No  Gang Involvement:No   Subjective: The patient reports an increase in anxiety and depression.  I completed both a GAD-7 and PHQ-9 score and they are recorded in the patient's chart.  He did send a message to his psychiatrist and started trazodone coming off of Seroquel.  On trazodone he feels that sleep is better and he is averaging 4 to 5 hours per night which is good for him per his report.  He is going out only enough to get food and even then he is trying to minimize how long he is going on where he goes how many people might be there etc.  After his panic attack at work his glucose level was at 192 and he was scheduled for blood work but it is anxiety kept him from being able to go.  He is supposed to reschedule but anxiety is keeping him from doing that.  He says that if anybody even drives closely to him he has to pull off the road because it makes him nervous.  He has difficulty making eye contact because he feels that he is  being judged.  He psychiatrist in the past has suggested that the patient might be on the autism spectrum and the possibility of ADD primarily inattentive type but to test were initially scheduled but were canceled and never rescheduled.  That has been several years ago.  I will look into the possibility of testing with a psychologist on our staff.  He says that he has extreme difficulty focusing.  He is a little better at it when he is at work because he has to be very specific because of his job responsibilities and being careful but gets distracted easily and feels that his mind is racing a lot.  He indicates that sleep is better but that he is not the best eater in the world.  Financially he is limited in what he can buy but says he is a comfort eater and at times overeats when he has stressed and anxious.  Lamoctal is the only thing that he feels has given him some relief in the past but he questions its effectiveness now.  He reports no benefit from having added Zoloft a couple of months ago.  I encouraged him to share that with his psychiatrist in the meeting tomorrow.  Some of the coping skills have brought some relief but his anticipatory anxiety continues to build as he is 2 weeks away from going back to work.  He says that he can  look at extended leave which urged him to consider doing especially if there may be medication changes after meeting with the psychiatrist tomorrow.  We looked at the cognitive aspect of challenging anxious and stressful thoughts as well as building and some coping skills for him to practice.  He reports very little interest in doing things that he even enjoys such as video games.  He does contract for safety and does not have suicidal thoughts but has passive thoughts that if he died it would be okay.   Interventions: Cognitive Behavioral Therapy  Diagnosis:G.A.D, with panic attacks, Social anxiety d/o, major depressive disorder, recurrent, moderate  Plan: I will meet  with the patient every 2 weeks via video session. Treatment plan: We will use cognitive behavioral therapy principles as well as elements of dialectical behavior therapy with a goal of reducing anxiety and depression by at least 50% with a target date of December 12, 2022.  Goals are to see the patient have less sadness as indicated by patient report and PHQ-9 scores, have improved mood and return to a healthier level of functioning, processed the causes for depressed mood and learn coping skills for depression.  Interventions will be using cognitive behavioral therapy to explore and replace unhealthy thoughts and behavior patterns contributing to depression, provide education about depression to help him identify its causes and triggers, encouraged sharing of feelings related to per depression.  Goals for reducing anxiety are to improve his ability to manage anxiety symptoms and better handle stress, identify causes for anxiety and provide coping skills for lowering it and help him manage thoughts and worrisome thinking contributing to feelings of anxiety.  I will provide education about anxiety to help him see its causes, teach coping skills for managing anxiety, use CBT to identify and change anxiety producing thoughts and behavior patterns as well as use DBT distress tolerance and mindfulness skills. Progress: 20%  French Ana, [facility]                 French Ana, [facility]               French Ana, [facility]

## 2022-06-27 ENCOUNTER — Encounter: Payer: Self-pay | Admitting: Psychiatry

## 2022-06-27 ENCOUNTER — Ambulatory Visit (INDEPENDENT_AMBULATORY_CARE_PROVIDER_SITE_OTHER): Payer: BC Managed Care – PPO | Admitting: Psychiatry

## 2022-06-27 VITALS — BP 146/86 | HR 101 | Temp 97.6°F | Ht 64.0 in | Wt 162.2 lb

## 2022-06-27 DIAGNOSIS — F4310 Post-traumatic stress disorder, unspecified: Secondary | ICD-10-CM

## 2022-06-27 DIAGNOSIS — F39 Unspecified mood [affective] disorder: Secondary | ICD-10-CM | POA: Diagnosis not present

## 2022-06-27 DIAGNOSIS — F4010 Social phobia, unspecified: Secondary | ICD-10-CM

## 2022-06-27 DIAGNOSIS — F0630 Mood disorder due to known physiological condition, unspecified: Secondary | ICD-10-CM | POA: Diagnosis not present

## 2022-06-27 MED ORDER — SERTRALINE HCL 100 MG PO TABS: 100.0000 mg | ORAL_TABLET | Freq: Every day | ORAL | 1 refills | Status: DC

## 2022-06-27 MED ORDER — TRAZODONE HCL 100 MG PO TABS: 100.0000 mg | ORAL_TABLET | Freq: Every evening | ORAL | 0 refills | Status: DC | PRN

## 2022-06-27 NOTE — Patient Instructions (Signed)
Continue lamotrigine 150 mg twice a day  Increase sertraline 100 mg at night  Next appointment: 6/26 at 10:30,  7/25 at 1:30

## 2022-07-05 ENCOUNTER — Telehealth: Payer: Self-pay | Admitting: Psychiatry

## 2022-07-05 NOTE — Telephone Encounter (Signed)
Patient called and asked about a fax that was sent to [place] from [facility]. Patient stated he needs to extend medical leave and that this is time sensitive. Patient asked for a call back-please advise

## 2022-07-05 NOTE — Telephone Encounter (Signed)
I do not think we have received any paperwork unless either of you have any. Please contact him to find this out.

## 2022-07-11 ENCOUNTER — Ambulatory Visit (INDEPENDENT_AMBULATORY_CARE_PROVIDER_SITE_OTHER): Payer: BC Managed Care – PPO | Admitting: Behavioral Health

## 2022-07-11 ENCOUNTER — Encounter: Payer: Self-pay | Admitting: Behavioral Health

## 2022-07-11 DIAGNOSIS — F411 Generalized anxiety disorder: Secondary | ICD-10-CM

## 2022-07-11 DIAGNOSIS — F331 Major depressive disorder, recurrent, moderate: Secondary | ICD-10-CM | POA: Diagnosis not present

## 2022-07-11 NOTE — Telephone Encounter (Signed)
called patient and notified him to have his company refax to our office

## 2022-07-11 NOTE — Progress Notes (Addendum)
[facility] Counselor/Therapist Progress Note  Patient ID: Todd Hobbs, MRN: [id number],    Date: 07/11/2022  Time Spent: 56 minutes via video session.  The patient was at home and this therapist was in his home office.  Treatment Type: Individual Therapy  Reported Symptoms: Anxiety, depression  Mental Status Exam: Appearance:  Well Groomed     Behavior: Appropriate  Motor: Normal  Speech/Language:  Normal Rate  Affect: Appropriate  Mood: anxious  Thought process: normal  Thought content:   WNL  Sensory/Perceptual disturbances:   WNL  Orientation: oriented to person, place, time/date, situation, day of week, and month of year  Attention: Good  Concentration: Good  Memory: WNL  Fund of knowledge:  Good  Insight:   Good  Judgment:  Good  Impulse Control: Good   Risk Assessment: Danger to Self:  No Self-injurious Behavior: No Danger to Others: No Duty to Warn:no Physical Aggression / Violence:No  Access to Firearms a concern: No  Gang Involvement:No   Subjective: The patient has met with a psychiatrist since our last session and she doubled his medication but he cannot see any significant benefit yet.  He describes his anxiety as up and down and his depression as worse.  Part of the increase in depression and especially recent spike in anxiety is related to the fact that he is supposed to go back to work tomorrow.  He is concerned that he cannot do that.  He still has hand tremors and is afraid he could not sharpen tools like he has in the past.  He still is getting extremely anxious being around people.  He has a week or 2 left on short-term disability but has had difficulty finding out all the details through [employer] who handles that type thing with his work.  He thought that [employer] had sent information to our office and to his psychiatrist office but I did check with our office and they have seen nothing come through from [employer].  He was going to try to call  [employer] again after today's session.  Worse case scenario if he does go back to work tomorrow we talked about the importance of taking his medication and using as many or all of the coping skills that he had not been working on in sessions over the past.  I reminded him that he has made some progress with his anxiety and to challenge those thoughts about what happened initially that led to his severe panic attack and him leaving work.  He is going to let me know if he has a conversation with [employer] tomorrow morning because he has to be at work tomorrow afternoon.  He even asked about second shift where there would be less people will therefore less anxiety and his boss is going to look into that possibility also.  He did catch up with a psychiatrist office but they moved so they did not have capability for receiving fax when he went by there yesterday.  I encouraged him to see if email information could be sent to him.  I offered email address to our office and to me that I could forward to our office if he gets any additional information from [employer].  He reports that any sudden or loud noise causes anxiety to go up significantly and there is a strong startle response.  He says that happens frequently at work and there is a buzzer that goes off when people come to the window for a told that  he can supply them with any child so that even wearing earplugs.  He does contract for safety having no thoughts of hurting himself or anyone else.  Interventions: Cognitive Behavioral Therapy  Diagnosis:G.A.D, with panic attacks, Social anxiety d/o, major depressive disorder, recurrent, moderate  Plan: I will meet with the patient every 2 weeks via video session. Treatment plan: We will use cognitive behavioral therapy principles as well as elements of dialectical behavior therapy with a goal of reducing anxiety and depression by at least 50% with a target date of December 12, 2022.  Goals are to see the patient have  less sadness as indicated by patient report and PHQ-9 scores, have improved mood and return to a healthier level of functioning, processed the causes for depressed mood and learn coping skills for depression.  Interventions will be using cognitive behavioral therapy to explore and replace unhealthy thoughts and behavior patterns contributing to depression, provide education about depression to help him identify its causes and triggers, encouraged sharing of feelings related to per depression.  Goals for reducing anxiety are to improve his ability to manage anxiety symptoms and better handle stress, identify causes for anxiety and provide coping skills for lowering it and help him manage thoughts and worrisome thinking contributing to feelings of anxiety.  I will provide education about anxiety to help him see its causes, teach coping skills for managing anxiety, use CBT to identify and change anxiety producing thoughts and behavior patterns as well as use DBT distress tolerance and mindfulness skills. Progress: 20%  Todd Hobbs, [facility]                 Todd Hobbs, [facility]               Todd Hobbs, [facility]               Todd Hobbs, [facility]

## 2022-07-17 NOTE — Telephone Encounter (Signed)
Noted. Please let me know if there is anything we need to do on our end.

## 2022-07-21 ENCOUNTER — Other Ambulatory Visit: Payer: Self-pay | Admitting: Psychiatry

## 2022-07-25 ENCOUNTER — Ambulatory Visit: Payer: BC Managed Care – PPO | Admitting: Behavioral Health

## 2022-07-26 ENCOUNTER — Encounter: Payer: Self-pay | Admitting: Behavioral Health

## 2022-07-26 ENCOUNTER — Ambulatory Visit (INDEPENDENT_AMBULATORY_CARE_PROVIDER_SITE_OTHER): Payer: BC Managed Care – PPO | Admitting: Behavioral Health

## 2022-07-26 DIAGNOSIS — F4310 Post-traumatic stress disorder, unspecified: Secondary | ICD-10-CM
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
## 2022-08-07 ENCOUNTER — Telehealth (INDEPENDENT_AMBULATORY_CARE_PROVIDER_SITE_OTHER): Payer: BC Managed Care – PPO | Admitting: Psychiatry

## 2022-08-07 ENCOUNTER — Encounter: Payer: Self-pay | Admitting: Psychiatry

## 2022-08-07 DIAGNOSIS — F39 Unspecified mood [affective] disorder: Secondary | ICD-10-CM

## 2022-08-07 DIAGNOSIS — F4010 Social phobia, unspecified: Secondary | ICD-10-CM

## 2022-08-07 DIAGNOSIS — F0630 Mood disorder due to known physiological condition, unspecified: Secondary | ICD-10-CM

## 2022-08-07 DIAGNOSIS — F4310 Post-traumatic stress disorder, unspecified: Secondary | ICD-10-CM

## 2022-08-07 NOTE — Patient Instructions (Signed)
Continue lamotrigine 150 mg twice a day  Start fluoxetine 10 mg daily for one week, then 20 mg daily  Decrease sertraline 50 mg at night for one week, then discontinue Next appointment:  7/25 at 1:30

## 2022-08-09 ENCOUNTER — Other Ambulatory Visit: Payer: Self-pay | Admitting: Psychiatry

## 2022-08-09 ENCOUNTER — Ambulatory Visit (INDEPENDENT_AMBULATORY_CARE_PROVIDER_SITE_OTHER): Payer: BC Managed Care – PPO | Admitting: Behavioral Health

## 2022-08-09 ENCOUNTER — Encounter: Payer: Self-pay | Admitting: Behavioral Health

## 2022-08-09 DIAGNOSIS — F411 Generalized anxiety disorder: Secondary | ICD-10-CM | POA: Diagnosis not present

## 2022-08-09 DIAGNOSIS — F4310 Post-traumatic stress disorder, unspecified: Secondary | ICD-10-CM | POA: Diagnosis not present

## 2022-08-09 MED ORDER — FLUOXETINE HCL 10 MG PO CAPS: 10.0000 mg | ORAL_CAPSULE | Freq: Every day | ORAL | 0 refills | Status: DC

## 2022-08-09 MED ORDER — FLUOXETINE HCL 20 MG PO CAPS: 20.0000 mg | ORAL_CAPSULE | Freq: Every day | ORAL | 0 refills | Status: DC

## 2022-08-09 NOTE — Progress Notes (Signed)
Behavioral Health Counselor/Therapist Progress Note  Patient ID: VASIL RIEDELL, MRN: [id number],    Date: 08/09/2022  Time Spent: 58 minutes via video session from 1:00 to 1:58 PM. This session was held via video teletherapy. The patient consented to the video teletherapy and was located in his home during this session. He is aware it is the responsibility of the patient to secure confidentiality on his end of the session. The provider was in a private home office for the duration of this session.      Treatment Type: Individual Therapy  Reported Symptoms: Anxiety, depression  Mental Status Exam: Appearance:  Well Groomed     Behavior: Appropriate  Motor: Normal  Speech/Language:  Normal Rate  Affect: Appropriate  Mood: anxious  Thought process: normal  Thought content:   WNL  Sensory/Perceptual disturbances:   WNL  Orientation: oriented to person, place, time/date, situation, day of week, and month of year  Attention: Good  Concentration: Good  Memory: WNL  Fund of knowledge:  Good  Insight:   Good  Judgment:  Good  Impulse Control: Good   Risk Assessment: Danger to Self:  No Self-injurious Behavior: No Danger to Others: No Duty to Warn:no Physical Aggression / Violence:No  Access to Firearms a concern: No  Gang Involvement:No   Subjective: The patient worked last week but was not able to complete the week.  He estimates that he worked between 20 and 30 hours.  Even working second shift when it is quieter he is still struggling.  He said that he is so hyper focused on doing his job that anytime a sudden noise happens it startles him and he starts to feel overwhelmed.  That happened last week with a running a buzzer for him to sharpen a tool and he told his supervisor that he had to leave and he has not gone back to work missing all of this week.  He is spoken to [employer] but they have not approved his extended leave.  I made them aware that had submitted all of  our visit notes up until this visit.  He also received the letter that I sent them.  He would like to be allowed 2 more months of leave but he is not sure what [employer] will decide is going to call his representative this afternoon.  He reported that a couple of weeks ago some friends of his who [place] works with invited him to come over there.  He knew their intentions were good trying to get him out of the house some but while over there became overwhelmed and fight or flight kicked and so he was leaving the home quickly and tripped in the yard catching himself with his hands.  He said his hands swelled up very quickly and he had to get them looked at.  There might be some ligament damage but nothing was broken and the swelling has gone down.  He said that is a prime example of the fact that it is just difficult for him to be in a public place or with other people even if he knows them well.  He is beginning to question if he can go back to work at all based on what happened last week.  He did speak with his psychiatrist who was switching him from Zoloft to Prozac to see if that might help with the anxiety and he should be starting that today or tomorrow.  Encouraged him to give that a chance.  Ask if there was a scenario in which he thought he could work and he says he feels now that he cannot do that at all with her to be a different shift around other people.  He says his home is a [employer].  He is aware of concerns for isolation but also knows that it feels overwhelming to him to be at help.  He was going to have to leave today to get some food because there is none in the house.  There is a Child psychotherapist close by where he can go and very quietly and leave if he has to and go back and later to get enough food to get him.  We began to look more at his history with anxiety starting from when he was a child.  For the most part childhood was good but there was always  undercurrent of anxiety although he reports no specific family history.  In visiting some family when he was 5 he was introduced to marijuana and alcohol and tobacco and knows that is when a lot of his life took a turn for the negative including seeing an increase in his anxiety.  He feels when he talks about parts of his life including young adulthood or late teenage he has to take any emotion out of the conversation to be able to talk about it and recognizes that is a big contributor to his diagnosis of PTSD.  I assured him that as we talked about it we would building coping skills and only do it in a way for the length of time that he felt he could tolerate.  He does contract for safety having no thoughts of hurting himself or anyone else.  Interventions: Cognitive Behavioral Therapy  Diagnosis:G.A.D, with panic attacks, Social anxiety d/o, major depressive disorder, recurrent, moderate  Plan: I will meet with the patient every 2 weeks via video session. Treatment plan: We will use cognitive behavioral therapy principles as well as elements of dialectical behavior therapy with a goal of reducing anxiety and depression by at least 50% with a target date of December 12, 2022.  Goals are to see the patient have less sadness as indicated by patient report and PHQ-9 scores, have improved mood and return to a healthier level of functioning, processed the causes for depressed mood and learn coping skills for depression.  Interventions will be using cognitive behavioral therapy to explore and replace unhealthy thoughts and behavior patterns contributing to depression, provide education about depression to help him identify its causes and triggers, encouraged sharing of feelings related to per depression.  Goals for reducing anxiety are to improve his ability to manage anxiety symptoms and better handle stress, identify causes for anxiety and provide coping skills for lowering it and help him manage thoughts and  worrisome thinking contributing to feelings of anxiety.  I will provide education about anxiety to help him see its causes, teach coping skills for managing anxiety, use CBT to identify and change anxiety producing thoughts and behavior patterns as well as use DBT distress tolerance and mindfulness skills. Progress: 20%  [place], [facility]                 French Ana, [facility]               French Ana, [facility]               French Ana, [facility]  [place], [place], [facility]

## 2022-08-16 ENCOUNTER — Encounter: Payer: Self-pay | Admitting: Behavioral Health

## 2022-08-16 ENCOUNTER — Ambulatory Visit (INDEPENDENT_AMBULATORY_CARE_PROVIDER_SITE_OTHER): Payer: BC Managed Care – PPO | Admitting: Behavioral Health

## 2022-08-16 DIAGNOSIS — F411 Generalized anxiety disorder: Secondary | ICD-10-CM | POA: Diagnosis not present

## 2022-08-16 DIAGNOSIS — F4310 Post-traumatic stress disorder, unspecified: Secondary | ICD-10-CM

## 2022-08-16 DIAGNOSIS — F331 Major depressive disorder, recurrent, moderate: Secondary | ICD-10-CM

## 2022-08-16 NOTE — Progress Notes (Signed)
[facility] Counselor/Therapist Progress Note  Patient ID: Todd Hobbs, MRN: [id number],    Date: 08/16/2022  Time Spent: 53 minutes via video session from 907 a.m.  to 10:00 AM. This session was held via video teletherapy. The patient consented to the video teletherapy and was located in his home during this session. He is aware it is the responsibility of the patient to secure confidentiality on his end of the session. The provider was in a private home office for the duration of this session.      Treatment Type: Individual Therapy  Reported Symptoms: Anxiety, depression  Mental Status Exam: Appearance:  Well Groomed     Behavior: Appropriate  Motor: Normal  Speech/Language:  Normal Rate  Affect: Appropriate  Mood: anxious  Thought process: normal  Thought content:   WNL  Sensory/Perceptual disturbances:   WNL  Orientation: oriented to person, place, time/date, situation, day of week, and month of year  Attention: Good  Concentration: Good  Memory: WNL  Fund of knowledge:  Good  Insight:   Good  Judgment:  Good  Impulse Control: Good   Risk Assessment: Danger to Self:  No Self-injurious Behavior: No Danger to Others: No Duty to Warn:no Physical Aggression / Violence:No  Access to Firearms a concern: No  Gang Involvement:No   Subjective: The patient spoke to a supervisor with [employer] on Tuesday of this week who indicated that they are trying to get extended leave for him.  He has now missed 2 weeks of work and is not sure whether or not he is getting paid.  He does not know when he will get an answer from [employer] either.  He reached out to his primary care physician to see if they might be able to do something to cement additional leave with his PCP felt that he needed to come from his psychiatrist.  His psychiatrist wants to see him 6 months before doing that.  He started on Prozac Sunday or Monday of this week so only has 3 to 4 days of that in his  system.  He is trying to be optimistic but historically Lamictal has been the only thing that he is seeing much relief from.  I encouraged him to give a time.  He feels that being on leave for a few more weeks to let the medicine work would be of benefit but he is not sure if that will be approved.  There are still a pretty strong startle response with various things at work.  He says it takes so much of his mental emotional and physical energy to hyperfocus on his job and that leaves him vulnerable to anybody walking up on him or saying something that he is not expecting increased anxiety and fear for him.  He knows that he is too anxious now to sharpen tools and says that there are other things he could do that are safe for but feels very exposed when he is at work.  He has been helping out the house 2 or 3 times over the past 2 weeks and that is primarily to get medication and food.  We started looking more at some of the roots of his anxiety.  When he was 62 year old that he was stating at the time pregnant and had an abortion.  He still has some thoughts that he is responsible for killing child.  When he was 62 and married his wife said that she had her tubes tied and could not get  pregnant and yet did and he prayed that they not have a child because he knew he was immature and not responsible enough to raise it and his wife miscarried that same night.  He feels responsible feeling like he contributed to that taking place.  Intellectually he knows that is not the case but emotionally he still feels responsibility for those 2 situations.  He feels responsible for some of the choices that he made in terms of not being mature enough leaning on alcohol too much.  He was a heavy drinker and a heavy cigarette smoker and yet found the strength to quit both after years of use.  He recognizes the need that he was binge spending especially online and stopped that.  We attempted to use cognitive re framing to help him  see the strength that he has and how he can start to overcome the anxiety that feels crippling to him now.  He does contract for safety having no thoughts of hurting himself or anyone else.  Interventions: Cognitive Behavioral Therapy  Diagnosis:G.A.D, with panic attacks, Social anxiety d/o, major depressive disorder, recurrent, moderate  Plan: I will meet with the patient every 2 weeks via video session. Treatment plan: We will use cognitive behavioral therapy principles as well as elements of dialectical behavior therapy with a goal of reducing anxiety and depression by at least 50% with a target date of December 12, 2022.  Goals are to see the patient have less sadness as indicated by patient report and PHQ-9 scores, have improved mood and return to a healthier level of functioning, processed the causes for depressed mood and learn coping skills for depression.  Interventions will be using cognitive behavioral therapy to explore and replace unhealthy thoughts and behavior patterns contributing to depression, provide education about depression to help him identify its causes and triggers, encouraged sharing of feelings related to per depression.  Goals for reducing anxiety are to improve his ability to manage anxiety symptoms and better handle stress, identify causes for anxiety and provide coping skills for lowering it and help him manage thoughts and worrisome thinking contributing to feelings of anxiety.  I will provide education about anxiety to help him see its causes, teach coping skills for managing anxiety, use CBT to identify and change anxiety producing thoughts and behavior patterns as well as use DBT distress tolerance and mindfulness skills. Progress: 20%  French Ana, [facility]                 French Ana, [facility]               French Ana, [facility]               French Ana, [facility]               French Ana,  [facility]               French Ana, [facility]               French Ana, [facility]

## 2022-08-30 ENCOUNTER — Encounter: Payer: Self-pay | Admitting: Behavioral Health

## 2022-08-30 ENCOUNTER — Ambulatory Visit (INDEPENDENT_AMBULATORY_CARE_PROVIDER_SITE_OTHER): Payer: BC Managed Care – PPO | Admitting: Behavioral Health

## 2022-08-30 DIAGNOSIS — F331 Major depressive disorder, recurrent, moderate: Secondary | ICD-10-CM | POA: Diagnosis not present

## 2022-08-30 DIAGNOSIS — F4010 Social phobia, unspecified: Secondary | ICD-10-CM | POA: Diagnosis not present

## 2022-08-30 DIAGNOSIS — F411 Generalized anxiety disorder: Secondary | ICD-10-CM

## 2022-08-30 DIAGNOSIS — F4310 Post-traumatic stress disorder, unspecified: Secondary | ICD-10-CM

## 2022-08-30 NOTE — Progress Notes (Signed)
Behavioral Health Counselor/Therapist Progress Note  Patient ID: Todd Hobbs, MRN: [id number],    Date: 08/30/2022  Time Spent: 56 minutes via video session from 10:00 a.m.  to 10:56 AM. This session was held via video teletherapy. The patient consented to the video teletherapy and was located in his home during this session. He is aware it is the responsibility of the patient to secure confidentiality on his end of the session. The provider was in a private home office for the duration of this session.      Treatment Type: Individual Therapy  Reported Symptoms: Anxiety, depression  Mental Status Exam: Appearance:  Well Groomed     Behavior: Appropriate  Motor: Normal  Speech/Language:  Normal Rate  Affect: Appropriate  Mood: anxious  Thought process: normal  Thought content:   WNL  Sensory/Perceptual disturbances:   WNL  Orientation: oriented to person, place, time/date, situation, day of week, and month of year  Attention: Good  Concentration: Good  Memory: WNL  Fund of knowledge:  Good  Insight:   Good  Judgment:  Good  Impulse Control: Good   Risk Assessment: Danger to Self:  No Self-injurious Behavior: No Danger to Others: No Duty to Warn:no Physical Aggression / Violence:No  Access to Firearms a concern: No  Gang Involvement:No   Subjective: The patient heard from [employer] and yesterday that they did not approve his extended leave.  They suggested that he reach out to his human resources department which he did this morning but has not heard back from them yet.  He knows that because of his extreme anxiety and panic he cannot go back to work.  He is not sure what his employer will do but knows that there is a good chance that he could lose his job.  He knows that they have to be able to counter employees and he because of his anxiety and panic is not able to go back to work.  He is despondent but does contract for safety.  He is starting to consider  options.  His sister is offered to let him move to where they live which he may have to do if he cannot work.  He has thought about disability but knows that is not an immediate process and has to be able to support himself.  We talked about what some of the questions might be if he did apply for disability including if he thought he could do something from home so that he did not have to interact much with people.  There are certain things that he can do and push past his anxiety a little such as going to a local [employer] to get some food but otherwise always feels as if someone is looking at him or judging him even though intellectually he knows that is not the case.  He has a lengthy history with anxiety and depression which was exacerbated by several life events that we talked about.  I introduced the possibility of referring him for EMDR therapy but there is some hesitation there because he would have to go in as opposed to doing those things remotely.  We talked about some things that he could do just to get out of the house such as sit on his back porch.  I encouraged the continued use of coping skills for anxiety but he feels that he is almost to the point where therapy and/or medication are not making a difference.  He will continue with therapy  and we scheduled several more appointments.  I will reintroduce the possibility of EMDR but wait to see what his employer decides. He does contract for safety having no thoughts of hurting himself or anyone else.  He does say that his sister who is good with details paperwork will help him with what ever he needs to do.  I encouraged him to reach out to her after talking to his employer.  Interventions: Cognitive Behavioral Therapy  Diagnosis:G.A.D, with panic attacks, Social anxiety d/o, major depressive disorder, recurrent, moderate  Plan: I will meet with the patient every 2 weeks via video session. Treatment plan: We will use cognitive behavioral  therapy principles as well as elements of dialectical behavior therapy with a goal of reducing anxiety and depression by at least 50% with a target date of December 12, 2022.  Goals are to see the patient have less sadness as indicated by patient report and PHQ-9 scores, have improved mood and return to a healthier level of functioning, processed the causes for depressed mood and learn coping skills for depression.  Interventions will be using cognitive behavioral therapy to explore and replace unhealthy thoughts and behavior patterns contributing to depression, provide education about depression to help him identify its causes and triggers, encouraged sharing of feelings related to per depression.  Goals for reducing anxiety are to improve his ability to manage anxiety symptoms and better handle stress, identify causes for anxiety and provide coping skills for lowering it and help him manage thoughts and worrisome thinking contributing to feelings of anxiety.  I will provide education about anxiety to help him see its causes, teach coping skills for managing anxiety, use CBT to identify and change anxiety producing thoughts and behavior patterns as well as use DBT distress tolerance and mindfulness skills. Progress: 20%  French Ana, [facility]                                                                                            [place], [facility]               [place] Ana, [facility]
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
## 2022-09-03 ENCOUNTER — Other Ambulatory Visit: Payer: Self-pay | Admitting: Psychiatry

## 2022-09-05 ENCOUNTER — Encounter: Payer: Self-pay | Admitting: Psychiatry

## 2022-09-05 ENCOUNTER — Ambulatory Visit (INDEPENDENT_AMBULATORY_CARE_PROVIDER_SITE_OTHER): Payer: BC Managed Care – PPO | Admitting: Psychiatry

## 2022-09-05 VITALS — BP 129/84 | HR 94 | Temp 97.8°F | Ht 64.0 in | Wt 164.2 lb

## 2022-09-05 DIAGNOSIS — F4310 Post-traumatic stress disorder, unspecified: Secondary | ICD-10-CM

## 2022-09-05 DIAGNOSIS — F4010 Social phobia, unspecified: Secondary | ICD-10-CM

## 2022-09-05 DIAGNOSIS — F0630 Mood disorder due to known physiological condition, unspecified: Secondary | ICD-10-CM | POA: Diagnosis not present

## 2022-09-05 DIAGNOSIS — G4700 Insomnia, unspecified: Secondary | ICD-10-CM

## 2022-09-05 MED ORDER — FLUOXETINE HCL 20 MG PO CAPS: 20.0000 mg | ORAL_CAPSULE | Freq: Every day | ORAL | 0 refills | Status: DC

## 2022-09-05 MED ORDER — TRAZODONE HCL 50 MG PO TABS: 50.0000 mg | ORAL_TABLET | Freq: Every evening | ORAL | 0 refills | Status: DC | PRN

## 2022-09-05 NOTE — Patient Instructions (Signed)
Continue lamotrigine 150 mg twice a day Continue fluoxetine 20 mg daily  Decrease trazodone 50 mg at night as needed for insomnia Next appointment:  9/12 1 pm

## 2022-09-09 ENCOUNTER — Telehealth: Payer: Self-pay | Admitting: Psychiatry

## 2022-09-09 NOTE — Telephone Encounter (Signed)
The form has been completed and was handed to the nurse for faxing.

## 2022-09-13 ENCOUNTER — Ambulatory Visit: Payer: BC Managed Care – PPO | Admitting: Behavioral Health

## 2022-09-27 ENCOUNTER — Encounter: Payer: Self-pay | Admitting: Behavioral Health

## 2022-09-27 ENCOUNTER — Ambulatory Visit: Payer: BC Managed Care – PPO | Admitting: Behavioral Health

## 2022-09-27 DIAGNOSIS — F411 Generalized anxiety disorder: Secondary | ICD-10-CM

## 2022-09-27 DIAGNOSIS — F4310 Post-traumatic stress disorder, unspecified: Secondary | ICD-10-CM

## 2022-09-27 DIAGNOSIS — F331 Major depressive disorder, recurrent, moderate: Secondary | ICD-10-CM | POA: Diagnosis not present

## 2022-09-27 DIAGNOSIS — F4010 Social phobia, unspecified: Secondary | ICD-10-CM | POA: Diagnosis not present

## 2022-09-27 DIAGNOSIS — F410 Panic disorder [episodic paroxysmal anxiety] without agoraphobia: Secondary | ICD-10-CM

## 2022-09-27 NOTE — Progress Notes (Signed)
[place] [facility] Counselor/Therapist Progress Note  Patient ID: BERWYN RIM, MRN: [id number],    Date: 09/27/2022  Time Spent: 51 minutes via video session from 10:00 a.m.  to 10:56 AM. This session was held via video teletherapy. The patient consented to the video teletherapy and was located in his home during this session. He is aware it is the responsibility of the patient to secure confidentiality on his end of the session. The provider was in a private home office for the duration of this session.      Treatment Type: Individual Therapy  Reported Symptoms: Anxiety, depression  Mental Status Exam: Appearance:  Well Groomed     Behavior: Appropriate  Motor: Normal  Speech/Language:  Normal Rate  Affect: Appropriate  Mood: anxious  Thought process: normal  Thought content:   WNL  Sensory/Perceptual disturbances:   WNL  Orientation: oriented to person, place, time/date, situation, day of week, and month of year  Attention: Good  Concentration: Good  Memory: WNL  Fund of knowledge:  Good  Insight:   Good  Judgment:  Good  Impulse Control: Good   Risk Assessment: Danger to Self:  No Self-injurious Behavior: No Danger to Others: No Duty to Warn:no Physical Aggression / Violence:No  Access to Firearms a concern: No  Gang Involvement:No   Subjective: The patient did get additional leave approved so he has been out for the past several weeks but is going back to work today.  He started on Prozac and is starting to see a positive change from that even on a low dose.  There is still some anxiety about going back to work but work has been very accommodating for him and has said they built in some things when he goes back such as having someone with him, allowing him to take breaks, leave early if he needs to do especially in the first few days.  He is aware of his startle response especially with the buzzer that indicate someone needs it will sharpen but says that he  feels he can concentrate on his work and minimize his reaction to that some.  He also feels that he has learned a lot about himself and is finding additional ways to cope with his anxiety.  I encouraged continued use of coping skills before going to work and while at work.  He feels that we can meet once a month and that will be beneficial to him. Interventions: Cognitive Behavioral Therapy  Diagnosis:G.A.D, with panic attacks, Social anxiety d/o, major depressive disorder, recurrent, moderate  Plan: I will meet with the patient every 2 weeks via video session. Treatment plan: We will use cognitive behavioral therapy principles as well as elements of dialectical behavior therapy with a goal of reducing anxiety and depression by at least 50% with a target date of December 12, 2022.  Goals are to see the patient have less sadness as indicated by patient report and PHQ-9 scores, have improved mood and return to a healthier level of functioning, processed the causes for depressed mood and learn coping skills for depression.  Interventions will be using cognitive behavioral therapy to explore and replace unhealthy thoughts and behavior patterns contributing to depression, provide education about depression to help him identify its causes and triggers, encouraged sharing of feelings related to per depression.  Goals for reducing anxiety are to improve his ability to manage anxiety symptoms and better handle stress, identify causes for anxiety and provide coping skills for lowering it and help him  manage thoughts and worrisome thinking contributing to feelings of anxiety.  I will provide education about anxiety to help him see its causes, teach coping skills for managing anxiety, use CBT to identify and change anxiety producing thoughts and behavior patterns as well as use DBT distress tolerance and mindfulness skills. Progress: 30%  [place],  [facility]                                                                                            [place], [facility]               [place], [facility]               [place], [facility]

## 2022-10-13 DIAGNOSIS — K4090 Unilateral inguinal hernia, without obstruction or gangrene, not specified as recurrent: Secondary | ICD-10-CM

## 2022-10-13 HISTORY — DX: Unilateral inguinal hernia, without obstruction or gangrene, not specified as recurrent: K40.90
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
## 2022-10-22 ENCOUNTER — Ambulatory Visit: Payer: Self-pay | Admitting: Surgery

## 2022-10-22 NOTE — H&P (Signed)
Subjective:    CC: Non-recurrent unilateral inguinal hernia without obstruction or gangrene [K40.90]   HPI:   Subjective Todd Hobbs is a 62 y.o. male who was referred by Joesphine Bare* for evaluation of above. Symptoms were first noted a few months ago. Pain is dull and intermittent, confined to the right groin and testicle, without radiation.  Associated with nothing, exacerbated by exertion.     Past Medical History:  has a past medical history of Anxiety, Arthritis, Bipolar 1 disorder (CMS/HHS-HCC), Cataract cortical, senile, COPD (chronic obstructive pulmonary disease) (CMS/HHS-HCC), Depression, History of pulmonary fibrosis, History of shingles, Nonunion of subtalar arthrodesis, and Pulmonary fibrosis (CMS/HHS-HCC).   Past Surgical History:  Past Surgical History       Past Surgical History:  Procedure Laterality Date   COLONOSCOPY N/A 06/11/2013    Dr. Demetrius Charity. Oh @ [facility] - Adenomatous Polyps i.e. Sess Serr Adenoma, rpt 3 rys per PYO   CATARACT EXTRACTION       EXTRACTION TEETH        Wisdom   FUSION FOOT SUBTALAR        s/p subtalar joint repair/fusion by Dr. Orland Jarred.   TONSILLECTOMY AND ADENOIDECTOMY       turp bladder            Family History: family history includes Alcohol abuse in his brother; Anxiety in his sister; Breast cancer in his cousin, maternal aunt, and sister; High blood pressure (Hypertension) in his mother; Kidney cancer in his father; Leukemia in his brother; Lung cancer in his father; Meniere's disease in his father; Prostate cancer in his brother; Skin cancer in his father and sister.   Social History:  reports that he quit smoking about 5 years ago. His smoking use included cigarettes. He started smoking about 48 years ago. He has a 63.7 pack-year smoking history. He has never used smokeless tobacco. He reports current alcohol use of about 2.0 standard drinks of alcohol per week. He reports current drug use. Frequency: 2.00 times per week. Drug:  Other-see comments.   Current Medications: has a current medication list which includes the following prescription(s): atorvastatin, celecoxib, fluoxetine, lamotrigine, and trazodone.   Allergies:     Allergies as of 10/22/2022   (No Known Allergies)      ROS:  A 15 point review of systems was performed and pertinent positives and negatives noted in HPI   Objective:      Objective BP 134/83   Pulse 98   Ht 160 cm (5' 2.99")   Wt 77.2 kg (170 lb 3.1 oz)   BMI 30.16 kg/m    Constitutional :  Alert, cooperative, no distress  Lymphatics/Throat:  Supple, no lymphadenopathy  Respiratory:  clear to auscultation bilaterally  Cardiovascular:  regular rate and rhythm  Gastrointestinal: soft, non-tender; bowel sounds normal; no masses,  no organomegaly. inguinal hernia noted.  small, reducible, no overlying skin changes, and right  Musculoskeletal: Steady gait and movement  Skin: Cool and moist, NO surgical scars  Psychiatric: Normal affect, non-agitated, not confused         LABS:  N/a    RADS: N/a Assessment:        Assessment Non-recurrent unilateral inguinal hernia without obstruction or gangrene [K40.90], right, very small.     Plan:        Plan 1. Non-recurrent unilateral inguinal hernia without obstruction or gangrene [K40.90]   Discussed the risk of surgery including recurrence, which can be up to 50% in the case of  incisional or complex hernias, possible use of prosthetic materials (mesh) and the increased risk of mesh infxn if used, bleeding, chronic pain, post-op infxn, post-op SBO or ileus, and possible re-operation to address said risks. The risks of general anesthetic, if used, includes MI, CVA, sudden death or even reaction to anesthetic medications also discussed. Alternatives include continued observation.  Benefits include possible symptom relief, prevention of incarceration, strangulation, enlargement in size over time, and the risk of emergency surgery in  the face of strangulation.    Typical post-op recovery time of 3-5 days with 2 weeks of activity restrictions were also discussed.   ED return precautions given for sudden increase in pain, size of hernia with accompanying fever, nausea, and/or vomiting.   The patient verbalized understanding and all questions were answered to the patient's satisfaction.     2. Patient has elected to proceed with surgical treatment. Procedure will be scheduled. Right side.  Briefly discussed CT to confirm due to small size, pt requesting proceeding with surgery due to possible insurance issues in the next few weeks. robotic assisted laparoscopic   labs/images/medications/previous chart entries reviewed personally and relevant changes/updates noted above.

## 2022-10-22 NOTE — H&P (View-Only) (Signed)
Subjective:    CC: Non-recurrent unilateral inguinal hernia without obstruction or gangrene [K40.90]   HPI:   Subjective Todd Hobbs is a 62 y.o. male who was referred by Joesphine Bare* for evaluation of above. Symptoms were first noted a few months ago. Pain is dull and intermittent, confined to the right groin and testicle, without radiation.  Associated with nothing, exacerbated by exertion.     Past Medical History:  has a past medical history of Anxiety, Arthritis, Bipolar 1 disorder (CMS/HHS-HCC), Cataract cortical, senile, COPD (chronic obstructive pulmonary disease) (CMS/HHS-HCC), Depression, History of pulmonary fibrosis, History of shingles, Nonunion of subtalar arthrodesis, and Pulmonary fibrosis (CMS/HHS-HCC).   Past Surgical History:  Past Surgical History       Past Surgical History:  Procedure Laterality Date   COLONOSCOPY N/A 06/11/2013    Dr. Demetrius Charity. Oh @ [facility] - Adenomatous Polyps i.e. Sess Serr Adenoma, rpt 3 rys per PYO   CATARACT EXTRACTION       EXTRACTION TEETH        Wisdom   FUSION FOOT SUBTALAR        s/p subtalar joint repair/fusion by Dr. Orland Jarred.   TONSILLECTOMY AND ADENOIDECTOMY       turp bladder            Family History: family history includes Alcohol abuse in his brother; Anxiety in his sister; Breast cancer in his cousin, maternal aunt, and sister; High blood pressure (Hypertension) in his mother; Kidney cancer in his father; Leukemia in his brother; Lung cancer in his father; Meniere's disease in his father; Prostate cancer in his brother; Skin cancer in his father and sister.   Social History:  reports that he quit smoking about 5 years ago. His smoking use included cigarettes. He started smoking about 48 years ago. He has a 63.7 pack-year smoking history. He has never used smokeless tobacco. He reports current alcohol use of about 2.0 standard drinks of alcohol per week. He reports current drug use. Frequency: 2.00 times per week. Drug:  Other-see comments.   Current Medications: has a current medication list which includes the following prescription(s): atorvastatin, celecoxib, fluoxetine, lamotrigine, and trazodone.   Allergies:     Allergies as of 10/22/2022   (No Known Allergies)      ROS:  A 15 point review of systems was performed and pertinent positives and negatives noted in HPI   Objective:      Objective BP 134/83   Pulse 98   Ht 160 cm (5' 2.99")   Wt 77.2 kg (170 lb 3.1 oz)   BMI 30.16 kg/m    Constitutional :  Alert, cooperative, no distress  Lymphatics/Throat:  Supple, no lymphadenopathy  Respiratory:  clear to auscultation bilaterally  Cardiovascular:  regular rate and rhythm  Gastrointestinal: soft, non-tender; bowel sounds normal; no masses,  no organomegaly. inguinal hernia noted.  small, reducible, no overlying skin changes, and right  Musculoskeletal: Steady gait and movement  Skin: Cool and moist, NO surgical scars  Psychiatric: Normal affect, non-agitated, not confused         LABS:  N/a    RADS: N/a Assessment:        Assessment Non-recurrent unilateral inguinal hernia without obstruction or gangrene [K40.90], right, very small.     Plan:        Plan 1. Non-recurrent unilateral inguinal hernia without obstruction or gangrene [K40.90]   Discussed the risk of surgery including recurrence, which can be up to 50% in the case of  incisional or complex hernias, possible use of prosthetic materials (mesh) and the increased risk of mesh infxn if used, bleeding, chronic pain, post-op infxn, post-op SBO or ileus, and possible re-operation to address said risks. The risks of general anesthetic, if used, includes MI, CVA, sudden death or even reaction to anesthetic medications also discussed. Alternatives include continued observation.  Benefits include possible symptom relief, prevention of incarceration, strangulation, enlargement in size over time, and the risk of emergency surgery in  the face of strangulation.    Typical post-op recovery time of 3-5 days with 2 weeks of activity restrictions were also discussed.   ED return precautions given for sudden increase in pain, size of hernia with accompanying fever, nausea, and/or vomiting.   The patient verbalized understanding and all questions were answered to the patient's satisfaction.     2. Patient has elected to proceed with surgical treatment. Procedure will be scheduled. Right side.  Briefly discussed CT to confirm due to small size, pt requesting proceeding with surgery due to possible insurance issues in the next few weeks. robotic assisted laparoscopic   labs/images/medications/previous chart entries reviewed personally and relevant changes/updates noted above.

## 2022-10-24 ENCOUNTER — Encounter: Payer: Self-pay | Admitting: Psychiatry

## 2022-10-24 ENCOUNTER — Telehealth (INDEPENDENT_AMBULATORY_CARE_PROVIDER_SITE_OTHER): Payer: BC Managed Care – PPO | Admitting: Psychiatry

## 2022-10-24 ENCOUNTER — Ambulatory Visit: Payer: BC Managed Care – PPO | Admitting: Behavioral Health

## 2022-10-24 ENCOUNTER — Encounter
Admission: RE | Admit: 2022-10-24 | Discharge: 2022-10-24 | Disposition: A | Discharge status: Home / Self Care | Payer: BC Managed Care – PPO | Source: Ambulatory Visit | Attending: Surgery | Admitting: Surgery

## 2022-10-24 VITALS — Ht 63.0 in | Wt 165.0 lb

## 2022-10-24 DIAGNOSIS — F0630 Mood disorder due to known physiological condition, unspecified: Secondary | ICD-10-CM

## 2022-10-24 DIAGNOSIS — E782 Mixed hyperlipidemia: Secondary | ICD-10-CM

## 2022-10-24 DIAGNOSIS — J449 Chronic obstructive pulmonary disease, unspecified: Secondary | ICD-10-CM

## 2022-10-24 DIAGNOSIS — F4010 Social phobia, unspecified: Secondary | ICD-10-CM

## 2022-10-24 DIAGNOSIS — F4310 Post-traumatic stress disorder, unspecified: Secondary | ICD-10-CM | POA: Diagnosis not present

## 2022-10-24 DIAGNOSIS — G4700 Insomnia, unspecified: Secondary | ICD-10-CM

## 2022-10-24 DIAGNOSIS — Z01812 Encounter for preprocedural laboratory examination: Secondary | ICD-10-CM

## 2022-10-24 HISTORY — DX: Pneumonia, unspecified organism: J18.9

## 2022-10-24 HISTORY — DX: Post-traumatic stress disorder, unspecified: F43.10

## 2022-10-24 HISTORY — DX: Cortical age-related cataract, unspecified eye: H25.019

## 2022-10-24 MED ORDER — FLUOXETINE HCL 40 MG PO CAPS: 40.0000 mg | ORAL_CAPSULE | Freq: Every day | ORAL | 0 refills | Status: DC

## 2022-10-24 MED ORDER — TRAZODONE HCL 50 MG PO TABS: 50.0000 mg | ORAL_TABLET | Freq: Every evening | ORAL | 0 refills | Status: DC | PRN

## 2022-10-24 NOTE — Patient Instructions (Signed)
Continue lamotrigine 150 mg twice a day Increase fluoxetine 40 mg daily  Continue trazodone 50 mg at night as needed for insomnia Next appointment:  11/4 at 1:30

## 2022-10-24 NOTE — Patient Instructions (Addendum)
Your procedure is scheduled on: Wednesday, September 18 Report to the Registration Desk on the 1st floor of the [facility] To find out your arrival time, please call [phone] between 1PM - 3PM on: Tuesday, September 17 If your arrival time is 6:00 am, do not arrive before that time as the [facility] entrance doors do not open until 6:00 am.  REMEMBER: Instructions that are not followed completely may result in serious medical risk, up to and including death; or upon the discretion of your surgeon and anesthesiologist your surgery may need to be rescheduled.  Do not eat food after midnight the night before surgery.  No gum chewing or hard candies.  You may however, drink CLEAR liquids up to 2 hours before you are scheduled to arrive for your surgery. Do not drink anything within 2 hours of your scheduled arrival time.  Clear liquids include: - water  - apple juice without pulp - gatorade (not RED colors) - black coffee or tea (Do NOT add milk or creamers to the coffee or tea) Do NOT drink anything that is not on this list.  One week prior to surgery: starting today, September 12 Stop Anti-inflammatories (NSAIDS) such as Advil, Aleve, Ibuprofen, Motrin, Naproxen, Naprosyn and Aspirin based products such as Excedrin, Goody's Powder, BC Powder. Stop ANY OVER THE COUNTER supplements until after surgery. You may however, continue to take Tylenol if needed for pain up until the day of surgery.  Continue taking all prescribed medications   TAKE ONLY THESE MEDICATIONS THE MORNING OF SURGERY WITH A SIP OF WATER:  Fluoxetine (Prozac) Lamotrigine (Lamictal)  No Alcohol for 24 hours before or after surgery.  No Smoking including e-cigarettes for 24 hours before surgery.  No chewable tobacco products for at least 6 hours before surgery.  No nicotine patches on the day of surgery.  Do not use any "recreational" drugs for at least a week (preferably 2 weeks) before your surgery.   Please be advised that the combination of cocaine and anesthesia may have negative outcomes, up to and including death. If you test positive for cocaine, your surgery will be cancelled.  On the morning of surgery brush your teeth with toothpaste and water, you may rinse your mouth with mouthwash if you wish. Do not swallow any toothpaste or mouthwash.  Use CHG Soap as directed on instruction sheet.  Do not wear jewelry, make-up, hairpins, clips or nail polish.  For welded (permanent) jewelry: bracelets, anklets, waist bands, etc.  Please have this removed prior to surgery.  If it is not removed, there is a chance that hospital personnel will need to cut it off on the day of surgery.  Do not wear lotions, powders, or perfumes.   Do not shave body hair from the neck down 48 hours before surgery.  Contact lenses, hearing aids and dentures may not be worn into surgery.  Do not bring valuables to the hospital. [facility] is not responsible for any missing/lost belongings or valuables.   Notify your doctor if there is any change in your medical condition (cold, fever, infection).  Wear comfortable clothing (specific to your surgery type) to the hospital.  After surgery, you can help prevent lung complications by doing breathing exercises.  Take deep breaths and cough every 1-2 hours. Your doctor may order a device called an Incentive Spirometer to help you take deep breaths. When coughing or sneezing, hold a pillow firmly against your incision with both hands. This is called "splinting." Doing  this helps protect your incision. It also decreases belly discomfort.  If you are being discharged the day of surgery, you will not be allowed to drive home. You will need a responsible individual to drive you home and stay with you for 24 hours after surgery.   If you are taking public transportation, you will need to have a responsible individual with you.  Please call the Pre-admissions Testing  Dept. at [phone] if you have any questions about these instructions.  Surgery Visitation Policy:  Patients having surgery or a procedure may have two visitors.  Children under the age of 58 must have an adult with them who is not the patient.     Preparing for Surgery with CHLORHEXIDINE GLUCONATE (CHG) Soap  Chlorhexidine Gluconate (CHG) Soap  o An antiseptic cleaner that kills germs and bonds with the skin to continue killing germs even after washing  o Used for showering the night before surgery and morning of surgery  Before surgery, you can play an important role by reducing the number of germs on your skin.  CHG (Chlorhexidine gluconate) soap is an antiseptic cleanser which kills germs and bonds with the skin to continue killing germs even after washing.  Please do not use if you have an allergy to CHG or antibacterial soaps. If your skin becomes reddened/irritated stop using the CHG.  1. Shower the NIGHT BEFORE SURGERY and the MORNING OF SURGERY with CHG soap.  2. If you choose to wash your hair, wash your hair first as usual with your normal shampoo.  3. After shampooing, rinse your hair and body thoroughly to remove the shampoo.  4. Use CHG as you would any other liquid soap. You can apply CHG directly to the skin and wash gently with a scrungie or a clean washcloth.  5. Apply the CHG soap to your body only from the neck down. Do not use on open wounds or open sores. Avoid contact with your eyes, ears, mouth, and genitals (private parts). Wash face and genitals (private parts) with your normal soap.  6. Wash thoroughly, paying special attention to the area where your surgery will be performed.  7. Thoroughly rinse your body with warm water.  8. Do not shower/wash with your normal soap after using and rinsing off the CHG soap.  9. Pat yourself dry with a clean towel.  10. Wear clean pajamas to bed the night before surgery.  12. Place clean sheets on your bed  the night of your first shower and do not sleep with pets.  13. Shower again with the CHG soap on the day of surgery prior to arriving at the hospital.  14. Do not apply any deodorants/lotions/powders.  15. Please wear clean clothes to the hospital.

## 2022-10-25 ENCOUNTER — Encounter
Admission: RE | Admit: 2022-10-25 | Discharge: 2022-10-25 | Disposition: A | Discharge status: Home / Self Care | Payer: BC Managed Care – PPO | Source: Ambulatory Visit | Attending: Surgery

## 2022-10-25 DIAGNOSIS — E782 Mixed hyperlipidemia: Secondary | ICD-10-CM | POA: Diagnosis not present

## 2022-10-25 DIAGNOSIS — J449 Chronic obstructive pulmonary disease, unspecified: Secondary | ICD-10-CM | POA: Diagnosis not present

## 2022-10-25 DIAGNOSIS — Z01818 Encounter for other preprocedural examination: Secondary | ICD-10-CM | POA: Diagnosis present

## 2022-10-25 DIAGNOSIS — Z01812 Encounter for preprocedural laboratory examination: Secondary | ICD-10-CM

## 2022-10-25 DIAGNOSIS — Z01810 Encounter for preprocedural cardiovascular examination: Secondary | ICD-10-CM | POA: Diagnosis not present

## 2022-10-25 LAB — CBC
HCT: 43.5 % (ref 39.0–52.0)
Hemoglobin: 14.9 g/dL (ref 13.0–17.0)
MCH: 30.1 pg (ref 26.0–34.0)
MCHC: 34.3 g/dL (ref 30.0–36.0)
MCV: 87.9 fL (ref 80.0–100.0)
Platelets: 340 10*3/uL (ref 150–400)
RBC: 4.95 MIL/uL (ref 4.22–5.81)
RDW: 12.7 % (ref 11.5–15.5)
WBC: 7.2 10*3/uL (ref 4.0–10.5)
nRBC: 0 % (ref 0.0–0.2)

## 2022-10-30 ENCOUNTER — Ambulatory Visit: Payer: Self-pay | Admitting: Urgent Care

## 2022-10-30 ENCOUNTER — Other Ambulatory Visit: Payer: Self-pay

## 2022-10-30 ENCOUNTER — Encounter
Admission: RE | Disposition: A | Discharge status: Home / Self Care | Payer: Self-pay | Source: Home / Self Care | Attending: Surgery

## 2022-10-30 ENCOUNTER — Ambulatory Visit
Admission: RE | Admit: 2022-10-30 | Discharge: 2022-10-30 | Disposition: A | Discharge status: Home / Self Care | Payer: BC Managed Care – PPO | Attending: Surgery | Admitting: Surgery

## 2022-10-30 ENCOUNTER — Ambulatory Visit: Payer: BC Managed Care – PPO | Admitting: General Practice

## 2022-10-30 ENCOUNTER — Encounter: Payer: Self-pay | Admitting: Surgery

## 2022-10-30 DIAGNOSIS — Z87891 Personal history of nicotine dependence: Secondary | ICD-10-CM | POA: Diagnosis not present

## 2022-10-30 DIAGNOSIS — F319 Bipolar disorder, unspecified: Secondary | ICD-10-CM | POA: Insufficient documentation

## 2022-10-30 DIAGNOSIS — J8410 Pulmonary fibrosis, unspecified: Secondary | ICD-10-CM | POA: Insufficient documentation

## 2022-10-30 DIAGNOSIS — J449 Chronic obstructive pulmonary disease, unspecified: Secondary | ICD-10-CM | POA: Diagnosis not present

## 2022-10-30 DIAGNOSIS — M1990 Unspecified osteoarthritis, unspecified site: Secondary | ICD-10-CM | POA: Diagnosis not present

## 2022-10-30 DIAGNOSIS — K4090 Unilateral inguinal hernia, without obstruction or gangrene, not specified as recurrent: Secondary | ICD-10-CM | POA: Diagnosis present

## 2022-10-30 DIAGNOSIS — F419 Anxiety disorder, unspecified: Secondary | ICD-10-CM | POA: Insufficient documentation

## 2022-10-30 SURGERY — HERNIORRHAPHY, INGUINAL, ROBOT-ASSISTED, LAPAROSCOPIC
Anesthesia: General | Site: Inguinal

## 2022-10-30 MED ORDER — ACETAMINOPHEN 500 MG PO TABS
1000.0000 mg | ORAL_TABLET | ORAL | Status: AC
  Administered 2022-10-30: 1000 mg via ORAL

## 2022-10-30 MED ORDER — CEFAZOLIN SODIUM-DEXTROSE 2-4 GM/100ML-% IV SOLN
INTRAVENOUS | Status: AC
  Filled 2022-10-30: qty 100

## 2022-10-30 MED ORDER — PROPOFOL 10 MG/ML IV BOLUS
INTRAVENOUS | Status: DC | PRN
  Administered 2022-10-30: 160 mg via INTRAVENOUS

## 2022-10-30 MED ORDER — GABAPENTIN 300 MG PO CAPS
ORAL_CAPSULE | ORAL | Status: AC
  Filled 2022-10-30: qty 1

## 2022-10-30 MED ORDER — HYDROMORPHONE HCL 1 MG/ML IJ SOLN
INTRAMUSCULAR | Status: AC
  Filled 2022-10-30: qty 1

## 2022-10-30 MED ORDER — FAMOTIDINE 20 MG PO TABS
ORAL_TABLET | ORAL | Status: AC
  Filled 2022-10-30: qty 1

## 2022-10-30 MED ORDER — OXYCODONE HCL 5 MG/5ML PO SOLN: 5.0000 mg | Freq: Once | ORAL | Status: DC | PRN

## 2022-10-30 MED ORDER — FENTANYL CITRATE (PF) 100 MCG/2ML IJ SOLN
25.0000 ug | INTRAMUSCULAR | Status: DC | PRN
  Administered 2022-10-30 (×2): 25 ug via INTRAVENOUS

## 2022-10-30 MED ORDER — BUPIVACAINE-EPINEPHRINE (PF) 0.5% -1:200000 IJ SOLN
INTRAMUSCULAR | Status: AC
  Filled 2022-10-30: qty 30

## 2022-10-30 MED ORDER — GLYCOPYRROLATE 0.2 MG/ML IJ SOLN
INTRAMUSCULAR | Status: DC | PRN
  Administered 2022-10-30: .2 mg via INTRAVENOUS

## 2022-10-30 MED ORDER — FENTANYL CITRATE (PF) 100 MCG/2ML IJ SOLN
INTRAMUSCULAR | Status: DC | PRN
  Administered 2022-10-30: 50 ug via INTRAVENOUS

## 2022-10-30 MED ORDER — CELECOXIB 200 MG PO CAPS
ORAL_CAPSULE | ORAL | Status: AC
  Filled 2022-10-30: qty 1

## 2022-10-30 MED ORDER — CEFAZOLIN SODIUM-DEXTROSE 2-4 GM/100ML-% IV SOLN
2.0000 g | INTRAVENOUS | Status: AC
  Administered 2022-10-30: 2 g via INTRAVENOUS

## 2022-10-30 MED ORDER — ONDANSETRON HCL 4 MG/2ML IJ SOLN
INTRAMUSCULAR | Status: DC | PRN
  Administered 2022-10-30 (×2): 4 mg via INTRAVENOUS

## 2022-10-30 MED ORDER — CELECOXIB 200 MG PO CAPS
200.0000 mg | ORAL_CAPSULE | ORAL | Status: AC
  Administered 2022-10-30: 200 mg via ORAL

## 2022-10-30 MED ORDER — ACETAMINOPHEN 325 MG PO TABS: 650.0000 mg | ORAL_TABLET | Freq: Three times a day (TID) | ORAL | 0 refills | Status: AC | PRN

## 2022-10-30 MED ORDER — PHENYLEPHRINE HCL-NACL 20-0.9 MG/250ML-% IV SOLN
INTRAVENOUS | Status: AC
  Filled 2022-10-30: qty 250

## 2022-10-30 MED ORDER — DEXMEDETOMIDINE HCL IN NACL 200 MCG/50ML IV SOLN
INTRAVENOUS | Status: DC | PRN
Start: 2022-10-30 — End: 2022-10-30
  Administered 2022-10-30: 12 ug via INTRAVENOUS

## 2022-10-30 MED ORDER — LACTATED RINGERS IV SOLN: INTRAVENOUS | Status: DC

## 2022-10-30 MED ORDER — SUGAMMADEX SODIUM 200 MG/2ML IV SOLN
INTRAVENOUS | Status: DC | PRN
  Administered 2022-10-30: 200 mg via INTRAVENOUS

## 2022-10-30 MED ORDER — MIDAZOLAM HCL 2 MG/2ML IJ SOLN
INTRAMUSCULAR | Status: DC | PRN
  Administered 2022-10-30: 2 mg via INTRAVENOUS

## 2022-10-30 MED ORDER — CHLORHEXIDINE GLUCONATE 0.12 % MT SOLN
OROMUCOSAL | Status: AC
  Filled 2022-10-30: qty 15

## 2022-10-30 MED ORDER — BUPIVACAINE-EPINEPHRINE 0.5% -1:200000 IJ SOLN
INTRAMUSCULAR | Status: DC | PRN
  Administered 2022-10-30: 15 mL

## 2022-10-30 MED ORDER — BUPIVACAINE LIPOSOME 1.3 % IJ SUSP
INTRAMUSCULAR | Status: DC | PRN
  Administered 2022-10-30: 20 mL

## 2022-10-30 MED ORDER — PHENYLEPHRINE HCL-NACL 20-0.9 MG/250ML-% IV SOLN
INTRAVENOUS | Status: DC | PRN
Start: 2022-10-30 — End: 2022-10-30
  Administered 2022-10-30: 15 ug/min via INTRAVENOUS

## 2022-10-30 MED ORDER — ACETAMINOPHEN 500 MG PO TABS
ORAL_TABLET | ORAL | Status: AC
  Filled 2022-10-30: qty 2

## 2022-10-30 MED ORDER — CHLORHEXIDINE GLUCONATE CLOTH 2 % EX PADS
6.0000 | MEDICATED_PAD | Freq: Once | CUTANEOUS | Status: AC
  Administered 2022-10-30: 6 via TOPICAL

## 2022-10-30 MED ORDER — GABAPENTIN 300 MG PO CAPS
300.0000 mg | ORAL_CAPSULE | ORAL | Status: AC
  Administered 2022-10-30: 300 mg via ORAL

## 2022-10-30 MED ORDER — FENTANYL CITRATE (PF) 100 MCG/2ML IJ SOLN
INTRAMUSCULAR | Status: AC
  Filled 2022-10-30: qty 2

## 2022-10-30 MED ORDER — HYDROCODONE-ACETAMINOPHEN 5-325 MG PO TABS: 1.0000 | ORAL_TABLET | Freq: Four times a day (QID) | ORAL | 0 refills | Status: DC | PRN

## 2022-10-30 MED ORDER — PHENYLEPHRINE 80 MCG/ML (10ML) SYRINGE FOR IV PUSH (FOR BLOOD PRESSURE SUPPORT)
PREFILLED_SYRINGE | INTRAVENOUS | Status: DC | PRN
  Administered 2022-10-30: 160 ug via INTRAVENOUS

## 2022-10-30 MED ORDER — LIDOCAINE HCL (CARDIAC) PF 100 MG/5ML IV SOSY
PREFILLED_SYRINGE | INTRAVENOUS | Status: DC | PRN
  Administered 2022-10-30: 100 mg via INTRAVENOUS

## 2022-10-30 MED ORDER — FAMOTIDINE 20 MG PO TABS
20.0000 mg | ORAL_TABLET | Freq: Once | ORAL | Status: AC
  Administered 2022-10-30: 20 mg via ORAL

## 2022-10-30 MED ORDER — CHLORHEXIDINE GLUCONATE 0.12 % MT SOLN
15.0000 mL | Freq: Once | OROMUCOSAL | Status: AC
  Administered 2022-10-30: 15 mL via OROMUCOSAL

## 2022-10-30 MED ORDER — ORAL CARE MOUTH RINSE: 15.0000 mL | Freq: Once | OROMUCOSAL | Status: AC

## 2022-10-30 MED ORDER — DOCUSATE SODIUM 100 MG PO CAPS: 100.0000 mg | ORAL_CAPSULE | Freq: Two times a day (BID) | ORAL | 0 refills | Status: AC | PRN

## 2022-10-30 MED ORDER — OXYCODONE HCL 5 MG PO TABS: 5.0000 mg | ORAL_TABLET | Freq: Once | ORAL | Status: DC | PRN

## 2022-10-30 MED ORDER — DEXAMETHASONE SODIUM PHOSPHATE 10 MG/ML IJ SOLN
INTRAMUSCULAR | Status: DC | PRN
  Administered 2022-10-30: 10 mg via INTRAVENOUS

## 2022-10-30 MED ORDER — BUPIVACAINE LIPOSOME 1.3 % IJ SUSP
INTRAMUSCULAR | Status: AC
  Filled 2022-10-30: qty 20

## 2022-10-30 MED ORDER — MIDAZOLAM HCL 2 MG/2ML IJ SOLN
INTRAMUSCULAR | Status: AC
  Filled 2022-10-30: qty 2

## 2022-10-30 MED ORDER — ROCURONIUM BROMIDE 100 MG/10ML IV SOLN
INTRAVENOUS | Status: DC | PRN
  Administered 2022-10-30: 50 mg via INTRAVENOUS

## 2022-10-30 MED ORDER — HYDROMORPHONE HCL 1 MG/ML IJ SOLN
INTRAMUSCULAR | Status: DC | PRN
Start: 2022-10-30 — End: 2022-10-30
  Administered 2022-10-30: 1 mg via INTRAVENOUS

## 2022-10-30 SURGICAL SUPPLY — 50 items
ADH SKN CLS APL DERMABOND .7 (GAUZE/BANDAGES/DRESSINGS) ×1
BAG PRESSURE INF REUSE 1000 (BAG) IMPLANT
BLADE SURG SZ11 CARB STEEL (BLADE) ×1 IMPLANT
BNDG GAUZE DERMACEA FLUFF 4 (GAUZE/BANDAGES/DRESSINGS) ×1 IMPLANT
BNDG GZE DERMACEA 4 6PLY (GAUZE/BANDAGES/DRESSINGS) ×1
COVER TIP SHEARS 8 DVNC (MISCELLANEOUS) ×1 IMPLANT
COVER WAND RF STERILE (DRAPES) ×1 IMPLANT
DERMABOND ADVANCED .7 DNX12 (GAUZE/BANDAGES/DRESSINGS) ×1 IMPLANT
DRAPE ARM DVNC X/XI (DISPOSABLE) ×3 IMPLANT
DRAPE COLUMN DVNC XI (DISPOSABLE) ×1 IMPLANT
ELECT CAUTERY BLADE 6.4 (BLADE) IMPLANT
ELECT REM PT RETURN 9FT ADLT (ELECTROSURGICAL) ×1
ELECTRODE REM PT RTRN 9FT ADLT (ELECTROSURGICAL) ×1 IMPLANT
FORCEPS BPLR FENES DVNC XI (FORCEP) ×1 IMPLANT
GLOVE BIOGEL PI IND STRL 7.0 (GLOVE) ×2 IMPLANT
GLOVE SURG SYN 6.5 ES PF (GLOVE) ×3 IMPLANT
GLOVE SURG SYN 6.5 PF PI (GLOVE) ×2 IMPLANT
GOWN STRL REUS W/ TWL LRG LVL3 (GOWN DISPOSABLE) ×3 IMPLANT
GOWN STRL REUS W/TWL LRG LVL3 (GOWN DISPOSABLE) ×3
IRRIGATOR SUCT 8 DISP DVNC XI (IRRIGATION / IRRIGATOR) IMPLANT
IV NS 1000ML (IV SOLUTION)
IV NS 1000ML BAXH (IV SOLUTION) IMPLANT
LABEL OR SOLS (LABEL) IMPLANT
MANIFOLD NEPTUNE II (INSTRUMENTS) ×1 IMPLANT
MESH 3DMAX MID 4X6 RT LRG (Mesh General) IMPLANT
NDL DRIVE SUT CUT DVNC (INSTRUMENTS) ×1 IMPLANT
NDL HYPO 22X1.5 SAFETY MO (MISCELLANEOUS) ×1 IMPLANT
NDL INSUFFLATION 14GA 120MM (NEEDLE) ×1 IMPLANT
NEEDLE DRIVE SUT CUT DVNC (INSTRUMENTS) ×1 IMPLANT
NEEDLE HYPO 22X1.5 SAFETY MO (MISCELLANEOUS) ×1 IMPLANT
NEEDLE INSUFFLATION 14GA 120MM (NEEDLE) ×1 IMPLANT
OBTURATOR OPTICAL STND 8 DVNC (TROCAR) ×1
OBTURATOR OPTICALSTD 8 DVNC (TROCAR) ×1 IMPLANT
PACK LAP CHOLECYSTECTOMY (MISCELLANEOUS) ×1 IMPLANT
PENCIL SMOKE EVACUATOR (MISCELLANEOUS) ×1 IMPLANT
SCISSORS MNPLR CVD DVNC XI (INSTRUMENTS) ×1 IMPLANT
SEAL UNIV 5-12 XI (MISCELLANEOUS) ×3 IMPLANT
SET TUBE SMOKE EVAC HIGH FLOW (TUBING) ×1 IMPLANT
SOL ELECTROSURG ANTI STICK (MISCELLANEOUS) ×1
SOLUTION ELECTROSURG ANTI STCK (MISCELLANEOUS) ×1 IMPLANT
SUT MNCRL 4-0 (SUTURE) ×1
SUT MNCRL 4-0 27XMFL (SUTURE) ×1
SUT VIC AB 2-0 SH 27 (SUTURE) ×1
SUT VIC AB 2-0 SH 27XBRD (SUTURE) ×1 IMPLANT
SUT VLOC 90 6 CV-15 VIOLET (SUTURE) ×1 IMPLANT
SUTURE MNCRL 4-0 27XMF (SUTURE) ×1 IMPLANT
SYR 30ML LL (SYRINGE) ×1 IMPLANT
TAPE TRANSPORE STRL 2 31045 (GAUZE/BANDAGES/DRESSINGS) ×1 IMPLANT
TRAP FLUID SMOKE EVACUATOR (MISCELLANEOUS) ×1 IMPLANT
WATER STERILE IRR 500ML POUR (IV SOLUTION) ×1 IMPLANT

## 2022-10-30 NOTE — Discharge Instructions (Addendum)
Hernia repair, Care After This sheet gives you information about how to care for yourself after your procedure. Your health care provider may also give you more specific instructions. If you have problems or questions, contact your health care provider. What can I expect after the procedure? After your procedure, it is common to have the following: Pain in your abdomen, especially in the incision areas. You will be given medicine to control the pain. Tiredness. This is a normal part of the recovery process. Your energy level will return to normal over the next several weeks. Changes in your bowel movements, such as constipation or needing to go more often. Talk with your health care provider about how to manage this. Follow these instructions at home: Medicines  tylenol as needed for discomfort.   Use narcotics, if prescribed, only when tylenol  is not enough to control pain.  325-650mg  every 8hrs to max of 3000mg /24hrs (including the 325mg  in every norco dose) for the tylenol.   PLEASE RECORD NUMBER OF PILLS TAKEN UNTIL NEXT FOLLOW UP APPT.  THIS WILL HELP DETERMINE HOW READY YOU ARE TO BE RELEASED FROM ANY ACTIVITY RESTRICTIONS Do not drive or use heavy machinery while taking prescription pain medicine. Do not drink alcohol while taking prescription pain medicine.  Incision care    Follow instructions from your health care provider about how to take care of your incision areas. Make sure you: Keep your incisions clean and dry. Wash your hands with soap and water before and after applying medicine to the areas, and before and after changing your bandage (dressing). If soap and water are not available, use hand sanitizer. Change your dressing as told by your health care provider. Leave stitches (sutures), skin glue, or adhesive strips in place. These skin closures may need to stay in place for 2 weeks or longer. If adhesive strip edges start to loosen and curl up, you may trim the loose edges.  Do not remove adhesive strips completely unless your health care provider tells you to do that. Do not wear tight clothing over the incisions. Tight clothing may rub and irritate the incision areas, which may cause the incisions to open. Do not take baths, swim, or use a hot tub until your health care provider approves. OK TO SHOWER IN 24HRS.   Check your incision area every day for signs of infection. Check for: More redness, swelling, or pain. More fluid or blood. Warmth. Pus or a bad smell. Activity Avoid lifting anything that is heavier than 10 lb (4.5 kg) for 2 weeks or until your health care provider says it is okay. No pushing/pulling greater than 30lbs You may resume normal activities as told by your health care provider. Ask your health care provider what activities are safe for you. Take rest breaks during the day as needed. Eating and drinking Follow instructions from your health care provider about what you can eat after surgery. To prevent or treat constipation while you are taking prescription pain medicine, your health care provider may recommend that you: Drink enough fluid to keep your urine clear or pale yellow. Take over-the-counter or prescription medicines. Eat foods that are high in fiber, such as fresh fruits and vegetables, whole grains, and beans. Limit foods that are high in fat and processed sugars, such as fried and sweet foods. General instructions Ask your health care provider when you will need an appointment to get your sutures or staples removed. Keep all follow-up visits as told by your health care  provider. This is important. Contact a health care provider if: You have more redness, swelling, or pain around your incisions. You have more fluid or blood coming from the incisions. Your incisions feel warm to the touch. You have pus or a bad smell coming from your incisions or your dressing. You have a fever. You have an incision that breaks open (edges not  staying together) after sutures or staples have been removed. You develop a rash. You have chest pain or difficulty breathing. You have pain or swelling in your legs. You feel light-headed or you faint. Your abdomen swells (becomes distended). You have nausea or vomiting. You have blood in your stool (feces). This information is not intended to replace advice given to you by your health care provider. Make sure you discuss any questions you have with your health care provider. Document Released: 08/17/2004 Document Revised: 10/17/2017 Document Reviewed: 10/30/2015 Elsevier Interactive Patient Education  2019 [employer]   AMBULATORY SURGERY  DISCHARGE INSTRUCTIONS   The drugs that you were given will stay in your system until tomorrow so for the next 24 hours you should not:  Drive an automobile Make any legal decisions Drink any alcoholic beverage   You may resume regular meals tomorrow.  Today it is better to start with liquids and gradually work up to solid foods.  You may eat anything you prefer, but it is better to start with liquids, then soup and crackers, and gradually work up to solid foods.   Please notify your doctor immediately if you have any unusual bleeding, trouble breathing, redness and pain at the surgery site, drainage, fever, or pain not relieved by medication.   Information for Discharge Teaching:  DO NOT REMOVE TEAL EXPAREL BRACELET FOR 96 hours, 4 day, 11/03/2022 EXPAREL (bupivacaine liposome injectable suspension)   Pain relief is important to your recovery. The goal is to control your pain so you can move easier and return to your normal activities as soon as possible after your procedure. Your physician may use several types of medicines to manage pain, swelling, and more.  Your surgeon or anesthesiologist gave you EXPAREL(bupivacaine) to help control your pain after surgery.  EXPAREL is a local anesthetic designed to release slowly over an extended  period of time to provide pain relief by numbing the tissue around the surgical site. EXPAREL is designed to release pain medication over time and can control pain for up to 72 hours. Depending on how you respond to EXPAREL, you may require less pain medication during your recovery. EXPAREL can help reduce or eliminate the need for opioids during the first few days after surgery when pain relief is needed the most. EXPAREL is not an opioid and is not addictive. It does not cause sleepiness or sedation.   Important! A teal colored band has been placed on your arm with the date, time and amount of EXPAREL you have received. Please leave this armband in place for the full 96 hours following administration, and then you may remove the band. If you return to the hospital for any reason within 96 hours following the administration of EXPAREL, the armband provides important information that your health care providers to know, and alerts them that you have received this anesthetic.    Possible side effects of EXPAREL: Temporary loss of sensation or ability to move in the area where medication was injected. Nausea, vomiting, constipation Rarely, numbness and tingling in your mouth or lips, lightheadedness, or anxiety may occur. Call your doctor  right away if you think you may be experiencing any of these sensations, or if you have other questions regarding possible side effects.  Follow all other discharge instructions given to you by your surgeon or nurse. Eat a healthy diet and drink plenty of water or other fluids.

## 2022-10-30 NOTE — Anesthesia Procedure Notes (Signed)
Procedure Name: Intubation Date/Time: 10/30/2022 7:37 AM  Performed by: Mohammed Kindle, CRNAPre-anesthesia Checklist: Patient identified, Emergency Drugs available, Suction available and Patient being monitored Patient Re-evaluated:Patient Re-evaluated prior to induction Oxygen Delivery Method: Circle system utilized Preoxygenation: Pre-oxygenation with 100% oxygen Induction Type: IV induction Ventilation: Mask ventilation without difficulty Laryngoscope Size: McGraph and 3 Grade View: Grade I Tube type: Oral Number of attempts: 1 Airway Equipment and Method: Stylet Placement Confirmation: ETT inserted through vocal cords under direct vision, positive ETCO2, breath sounds checked- equal and bilateral and CO2 detector Secured at: 21 cm Tube secured with: Tape Dental Injury: Teeth and Oropharynx as per pre-operative assessment

## 2022-10-30 NOTE — Interval H&P Note (Signed)
No change. OK to proceed.

## 2022-10-30 NOTE — Op Note (Signed)
Preoperative diagnosis: Right, initial reducible inguinal Hernia.  Postoperative diagnosis: Right inguinal Hernia  Procedure: Robotic assisted laparoscopic right inguinal hernia repair with mesh  Anesthesia: General  Surgeon: Dr. Tonna Boehringer  Wound Classification: Clean  Specimen: none  Complications: None  Estimated Blood Loss: 10mL   Indications:  inguinal hernia. Repair was indicated to avoid complications of incarceration, obstruction and pain, and a prosthetic mesh repair was elected.  See H&P for further details.  Findings: 1. Vas Deferens and cord structures identified and preserved 2. Bard 3D max medium weight mesh used for repair 3. Adequate hemostasis achieved  Description of procedure: The patient was taken to the operating room. A time-out was completed verifying correct patient, procedure, site, positioning, and implant(s) and/or special equipment prior to beginning this procedure.  Area was prepped and draped in the usual sterile fashion. An incision was marked 20 cm above the pubic tubercle, slightly above the umbilicus  Scrotum wrapped in Kerlix roll.  Veress needle inserted at palmer's point.  Saline drop test noted to be positive with gradual increase in pressure after initiation of gas insufflation.  15 mm of pressure was achieved prior to removing the Veress needle and then placing a 8 mm port via the Optiview technique through the supraumbilical site.  Inspection of the area afterwards noted no injury to the surrounding organs during insertion of the needle and the port.  2 port sites were marked 8 cm to the lateral sides of the initial port, and a 8 mm robotic port was placed on the left side, another 8 mm robotic port on the right side under direct supervision.  Local anesthesia  infused to the preplanned incision sites prior to insertion of the port.  The [employer] platform was then brought into the operative field and docked to the ports.  Examination of the  abdominal cavity noted a right inguinal hernia.  A peritoneal flap was created approximately 8cm cephalad to the defect by using scissors with electrocautery.  Dissection was carried down towards the pubic tubercle, developing the myopectineal orifice view.  Laterally the flap was carried towards the ASIS.  Small hernia sac was noted, which carefully dissected away from the adjacent tissues to be fully reduced out of hernia cavity.  Any bleeding was controlled with combination of electrocautery and manual pressure.    After confirming adequate dissection and the peritoneal reflection completely down and away from the cord structures, a Large Bard 3DMax medium weight mesh was placed within the anterior abdominal wall, secured in place using 2-0 Vicryl on an SH needle immediately above the pubic tubercle.  After noting proper placement of the mesh with the peritoneal reflection deep to it, the previously created peritoneal flap was secured back up to the anterior abdominal wall using running 3-0 V-Lock.  Both needles were then removed out of the abdominal cavity, Xi platform undocked from the ports and removed off of operative field.  exparel infused as ilioinguinal block.  Abdomen then desufflated and ports removed. All the skin incisions were then closed with a subcuticular stitch of Monocryl 4-0. Dermabond was applied. The testis was gently pulled down into its anatomic position in the scrotum.  The patient tolerated the procedure well and was taken to the postanesthesia care unit in stable condition. Sponge and instrument count correct at end of procedure.

## 2022-10-30 NOTE — Transfer of Care (Signed)
Immediate Anesthesia Transfer of Care Note  Patient: Todd Hobbs  Procedure(s) Performed: XI ROBOTIC ASSISTED INGUINAL HERNIA w/ mesh (Inguinal)  Patient Location: PACU  Anesthesia Type:General  Level of Consciousness: awake, drowsy, and patient cooperative  Airway & Oxygen Therapy: Patient Spontanous Breathing and Patient connected to face mask oxygen  Post-op Assessment: Report given to RN and Post -op Vital signs reviewed and stable  Post vital signs: Reviewed and stable  Last Vitals:  Vitals Value Taken Time  BP 154/97 10/30/22 0855  Temp 36.3 C 10/30/22 0855  Pulse 103 10/30/22 0858  Resp 19 10/30/22 0858  SpO2 98 % 10/30/22 0858  Vitals shown include unfiled device data.  Last Pain:  Vitals:   10/30/22 0855  TempSrc:   PainSc: 0-No pain         Complications: No notable events documented.

## 2022-10-30 NOTE — Anesthesia Postprocedure Evaluation (Signed)
Anesthesia Post Note  Patient: Todd Hobbs  Procedure(s) Performed: XI ROBOTIC ASSISTED INGUINAL HERNIA w/ mesh (Inguinal)  Patient location during evaluation: PACU Anesthesia Type: General Level of consciousness: awake and alert Pain management: pain level controlled Vital Signs Assessment: post-procedure vital signs reviewed and stable Respiratory status: spontaneous breathing, nonlabored ventilation, respiratory function stable and patient connected to nasal cannula oxygen Cardiovascular status: blood pressure returned to baseline and stable Postop Assessment: no apparent nausea or vomiting Anesthetic complications: no  No notable events documented.   Last Vitals:  Vitals:   10/30/22 0923 10/30/22 0932  BP:  107/85  Pulse: 88 88  Resp: 16 14  Temp: (!) 36.1 C 37.1 C  SpO2: 92% 98%    Last Pain:  Vitals:   10/30/22 0932  TempSrc: Temporal  PainSc: 0-No pain                 Stephanie Coup

## 2022-10-30 NOTE — Anesthesia Preprocedure Evaluation (Signed)
Anesthesia Evaluation  Patient identified by MRN, date of birth, ID band Patient awake    Reviewed: Allergy & Precautions, NPO status , Patient's Chart, lab work & pertinent test results  Airway Mallampati: III  TM Distance: >3 FB Neck ROM: full    Dental  (+) Teeth Intact, Dental Advidsory Given   Pulmonary neg pulmonary ROS, COPD,  COPD inhaler, former smoker   Pulmonary exam normal  + decreased breath sounds      Cardiovascular Exercise Tolerance: Poor negative cardio ROS Normal cardiovascular exam Rhythm:Regular     Neuro/Psych   Anxiety Depression Bipolar Disorder   negative neurological ROS  negative psych ROS   GI/Hepatic negative GI ROS, Neg liver ROS,,,  Endo/Other  negative endocrine ROS    Renal/GU      Musculoskeletal   Abdominal   Peds  Hematology negative hematology ROS (+)   Anesthesia Other Findings Past Medical History: No date: Anxiety No date: Arthritis     Comment:  ankles No date: Bipolar 1 disorder (HCC) No date: Cataract cortical, senile No date: COPD (chronic obstructive pulmonary disease) (HCC) No date: Depression No date: Gout No date: History of shingles No date: HLD (hyperlipidemia) No date: Knee pain, right     Comment:  wears brace No date: Pneumonia No date: PTSD (post-traumatic stress disorder) No date: Pulmonary fibrosis (HCC) 10/2022: Right inguinal hernia No date: Vertigo     Comment:  last episode approx 1/17  Past Surgical History: No date: CATARACT EXTRACTION W/ INTRAOCULAR LENS  IMPLANT, BILATERAL 2020: CHEILECTOMY; Bilateral No date: COLONOSCOPY     Comment:  2015, 2018 06/08/2015: EXCISION PARTIAL PHALANX; Right     Comment:  Procedure: EXCISION BONE PHALANX RIGHTT AND LEFTT 5TH               TOES---Resection of proximal phalanx head and the lateral              portion the middle distal phalanges right fifth toe               Resection of proximal phalanx  head and lateral portion of              the middle and distal phalanges left fifth toe ;                Surgeon: Recardo Evangelist, DPM;  Location: [facility]               [facility];  Service: Podiatry;  Laterality: Right;  LOCAL WITH              IVA 1982: TONSILLECTOMY AND ADENOIDECTOMY 07/24/2016: TRANSURETHRAL RESECTION OF BLADDER TUMOR; N/A     Comment:  Procedure: TRANSURETHRAL RESECTION OF BLADDER TUMOR               (TURBT) (SMALL);  Surgeon: Hildred Laser, MD;                Location: [facility] ORS;  Service: Urology;  Laterality: N/A; No date: WISDOM TOOTH EXTRACTION  BMI    Body Mass Index: 28.34 kg/m      Reproductive/Obstetrics negative OB ROS                             Anesthesia Physical Anesthesia Plan  ASA: 3  Anesthesia Plan: General ETT and General   Post-op Pain Management:    Induction: Intravenous  PONV Risk Score and Plan: 2 and Dexamethasone,  Midazolam and Ondansetron  Airway Management Planned: Oral ETT  Additional Equipment:   Intra-op Plan:   Post-operative Plan: Extubation in OR  Informed Consent: I have reviewed the patients History and Physical, chart, labs and discussed the procedure including the risks, benefits and alternatives for the proposed anesthesia with the patient or authorized representative who has indicated his/her understanding and acceptance.     Dental Advisory Given  Plan Discussed with: Anesthesiologist, CRNA and Surgeon  Anesthesia Plan Comments: (Patient consented for risks of anesthesia including but not limited to:  - adverse reactions to medications - damage to eyes, teeth, lips or other oral mucosa - nerve damage due to positioning  - sore throat or hoarseness - Damage to heart, brain, nerves, lungs, other parts of body or loss of life  Patient voiced understanding.)       Anesthesia Quick Evaluation

## 2022-10-31 ENCOUNTER — Ambulatory Visit: Payer: BC Managed Care – PPO | Admitting: Behavioral Health
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
## 2022-12-14 ENCOUNTER — Other Ambulatory Visit: Payer: Self-pay | Admitting: Psychiatry

## 2022-12-16 ENCOUNTER — Encounter: Payer: Self-pay | Admitting: Psychiatry

## 2022-12-16 ENCOUNTER — Ambulatory Visit (INDEPENDENT_AMBULATORY_CARE_PROVIDER_SITE_OTHER): Payer: BC Managed Care – PPO | Admitting: Psychiatry

## 2022-12-16 VITALS — BP 124/85 | HR 76 | Temp 98.6°F | Ht 63.0 in | Wt 175.6 lb

## 2022-12-16 DIAGNOSIS — F4010 Social phobia, unspecified: Secondary | ICD-10-CM | POA: Diagnosis not present

## 2022-12-16 DIAGNOSIS — G4700 Insomnia, unspecified: Secondary | ICD-10-CM | POA: Diagnosis not present

## 2022-12-16 DIAGNOSIS — F4310 Post-traumatic stress disorder, unspecified: Secondary | ICD-10-CM

## 2022-12-16 DIAGNOSIS — F0630 Mood disorder due to known physiological condition, unspecified: Secondary | ICD-10-CM | POA: Diagnosis not present

## 2022-12-16 MED ORDER — FLUOXETINE HCL 40 MG PO CAPS: 40.0000 mg | ORAL_CAPSULE | Freq: Every day | ORAL | 0 refills | Status: DC

## 2022-12-16 NOTE — Patient Instructions (Signed)
Continue lamotrigine 150 mg twice a day Increase fluoxetine 60 mg daily  Continue trazodone 50 mg at night as needed for insomnia Next appointment: 1/2 at 3:30
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
## 2023-02-13 ENCOUNTER — Encounter: Payer: Self-pay | Admitting: Psychiatry

## 2023-02-13 ENCOUNTER — Ambulatory Visit (INDEPENDENT_AMBULATORY_CARE_PROVIDER_SITE_OTHER): Payer: BC Managed Care – PPO | Admitting: Psychiatry

## 2023-02-13 VITALS — BP 138/88 | HR 95 | Temp 98.8°F | Ht 63.0 in | Wt 180.8 lb

## 2023-02-13 DIAGNOSIS — F4010 Social phobia, unspecified: Secondary | ICD-10-CM

## 2023-02-13 DIAGNOSIS — F0630 Mood disorder due to known physiological condition, unspecified: Secondary | ICD-10-CM

## 2023-02-13 DIAGNOSIS — F4310 Post-traumatic stress disorder, unspecified: Secondary | ICD-10-CM

## 2023-02-13 MED ORDER — FLUOXETINE HCL 20 MG PO CAPS: 20.0000 mg | ORAL_CAPSULE | Freq: Every day | ORAL | 0 refills | Status: DC

## 2023-02-13 MED ORDER — TRAZODONE HCL 50 MG PO TABS: 50.0000 mg | ORAL_TABLET | Freq: Every evening | ORAL | 0 refills | Status: DC | PRN

## 2023-02-25 DIAGNOSIS — K219 Gastro-esophageal reflux disease without esophagitis: Secondary | ICD-10-CM | POA: Insufficient documentation

## 2023-03-17 ENCOUNTER — Ambulatory Visit (INDEPENDENT_AMBULATORY_CARE_PROVIDER_SITE_OTHER): Payer: BC Managed Care – PPO | Admitting: Professional Counselor

## 2023-03-17 DIAGNOSIS — F4310 Post-traumatic stress disorder, unspecified: Secondary | ICD-10-CM | POA: Diagnosis not present

## 2023-03-17 DIAGNOSIS — F1290 Cannabis use, unspecified, uncomplicated: Secondary | ICD-10-CM | POA: Diagnosis not present

## 2023-03-17 DIAGNOSIS — F4010 Social phobia, unspecified: Secondary | ICD-10-CM | POA: Diagnosis not present
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
## 2023-03-27 ENCOUNTER — Encounter: Payer: Self-pay | Admitting: Psychiatry

## 2023-03-27 ENCOUNTER — Ambulatory Visit: Payer: BC Managed Care – PPO | Admitting: Psychiatry

## 2023-03-27 VITALS — BP 118/80 | HR 97 | Temp 97.7°F | Ht 63.0 in | Wt 178.4 lb

## 2023-03-27 DIAGNOSIS — F4310 Post-traumatic stress disorder, unspecified: Secondary | ICD-10-CM | POA: Diagnosis not present

## 2023-03-27 DIAGNOSIS — F4010 Social phobia, unspecified: Secondary | ICD-10-CM

## 2023-03-27 DIAGNOSIS — F0630 Mood disorder due to known physiological condition, unspecified: Secondary | ICD-10-CM

## 2023-03-27 MED ORDER — ARIPIPRAZOLE 2 MG PO TABS: 2.0000 mg | ORAL_TABLET | Freq: Every day | ORAL | 1 refills | Status: DC

## 2023-03-27 MED ORDER — FLUOXETINE HCL 40 MG PO CAPS: 40.0000 mg | ORAL_CAPSULE | Freq: Every day | ORAL | 0 refills | Status: DC

## 2023-03-27 NOTE — Patient Instructions (Signed)
Continue lamotrigine 150 mg twice a day Start Abilify 2 mg at night  Continue fluoxetine 60 mg daily  Continue trazodone 50 mg at night as needed for insomnia Next appointment: 4/10 at 10:30

## 2023-04-01 ENCOUNTER — Ambulatory Visit (INDEPENDENT_AMBULATORY_CARE_PROVIDER_SITE_OTHER): Payer: BC Managed Care – PPO | Admitting: Professional Counselor

## 2023-04-01 DIAGNOSIS — F4310 Post-traumatic stress disorder, unspecified: Secondary | ICD-10-CM
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
## 2023-04-08 ENCOUNTER — Ambulatory Visit (INDEPENDENT_AMBULATORY_CARE_PROVIDER_SITE_OTHER): Payer: BC Managed Care – PPO | Admitting: Professional Counselor

## 2023-04-08 DIAGNOSIS — F4310 Post-traumatic stress disorder, unspecified: Secondary | ICD-10-CM | POA: Diagnosis not present
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
## 2023-04-15 ENCOUNTER — Ambulatory Visit (INDEPENDENT_AMBULATORY_CARE_PROVIDER_SITE_OTHER): Payer: BC Managed Care – PPO | Admitting: Professional Counselor

## 2023-04-15 DIAGNOSIS — F4310 Post-traumatic stress disorder, unspecified: Secondary | ICD-10-CM

## 2023-04-15 DIAGNOSIS — F411 Generalized anxiety disorder: Secondary | ICD-10-CM | POA: Diagnosis not present

## 2023-04-15 NOTE — Progress Notes (Unsigned)
   THERAPIST PROGRESS NOTE  Session Time: 11:07 AM - 12:00 PM  Participation Level: {BHH PARTICIPATION LEVEL:22264}  Behavioral Response: {Appearance:22683}{BHH LEVEL OF CONSCIOUSNESS:22305}{BHH MOOD:22306}  Type of Therapy: {CHL AMB BH Type of Therapy:21022741}  Treatment Goals addressed: ***  ProgressTowards Goals: {Progress Towards Goals:21014066}  Interventions: {CHL AMB BH Type of Intervention:21022753}  Summary: Todd Hobbs is a 63 y.o. male who presents with ***. Sleep hygiene "It really hasn't been that bad." "I did get angry twice."   Suicidal/Homicidal: {BHH YES OR NO:22294}{yes/no/with/without intent/plan:22693}  Therapist Response: MI skills, STOP skill, ABC model, cognitive distortions, coping skills for depression  Plan: Return again in *** weeks.  Diagnosis: No diagnosis found.  Collaboration of Care: {BH OP Collaboration of Care:21014065}  Patient/Guardian was advised Release of Information must be obtained prior to any record release in order to collaborate their care with an outside provider. Patient/Guardian was advised if they have not already done so to contact the registration department to sign all necessary forms in order for [place] to release information regarding their care.   Consent: Patient/Guardian gives verbal consent for treatment and assignment of benefits for services provided during this visit. Patient/Guardian expressed understanding and agreed to proceed.   [name], [facility] 04/15/2023

## 2023-04-19 ENCOUNTER — Other Ambulatory Visit: Payer: Self-pay | Admitting: Psychiatry

## 2023-04-30 ENCOUNTER — Ambulatory Visit (INDEPENDENT_AMBULATORY_CARE_PROVIDER_SITE_OTHER): Payer: BC Managed Care – PPO | Admitting: Professional Counselor

## 2023-04-30 DIAGNOSIS — F411 Generalized anxiety disorder: Secondary | ICD-10-CM

## 2023-04-30 DIAGNOSIS — F4310 Post-traumatic stress disorder, unspecified: Secondary | ICD-10-CM | POA: Diagnosis not present

## 2023-04-30 NOTE — Progress Notes (Unsigned)
  THERAPIST PROGRESS NOTE  Session Time: 11:08 AM - 11:53 AM   Participation Level: {BHH PARTICIPATION LEVEL:22264}  Behavioral Response: {Appearance:22683}{BHH LEVEL OF CONSCIOUSNESS:22305}{BHH MOOD:22306}  Type of Therapy: {CHL AMB BH Type of Therapy:21022741}  Treatment Goals addressed: ***  ProgressTowards Goals: {Progress Towards Goals:21014066}  Interventions: {CHL AMB BH Type of Intervention:21022753}  Summary: Todd Hobbs is a 63 y.o. male who presents with ***. "Decent" ac/heater broke and had to clean house to have someone come, ordered $400 in gummies can't keep smoking/coughing, receptive to CPT and psychoeducation, hw assignment   Suicidal/Homicidal: {BHH YES OR NO:22294}{yes/no/with/without intent/plan:22693}  Therapist Response: ***  Plan: Return again in *** weeks.  Diagnosis: No diagnosis found.  Collaboration of Care: {BH OP Collaboration of Care:21014065}  Patient/Guardian was advised Release of Information must be obtained prior to any record release in order to collaborate their care with an outside provider. Patient/Guardian was advised if they have not already done so to contact the registration department to sign all necessary forms in order for [place] to release information regarding their care.   Consent: Patient/Guardian gives verbal consent for treatment and assignment of benefits for services provided during this visit. Patient/Guardian expressed understanding and agreed to proceed.   [name], [facility] 04/30/2023

## 2023-05-07 ENCOUNTER — Ambulatory Visit (INDEPENDENT_AMBULATORY_CARE_PROVIDER_SITE_OTHER): Payer: BC Managed Care – PPO | Admitting: Professional Counselor

## 2023-05-07 DIAGNOSIS — F4310 Post-traumatic stress disorder, unspecified: Secondary | ICD-10-CM | POA: Diagnosis not present
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
## 2023-05-08 ENCOUNTER — Telehealth: Payer: Self-pay | Admitting: Professional Counselor

## 2023-05-14 ENCOUNTER — Ambulatory Visit: Payer: BC Managed Care – PPO | Admitting: Professional Counselor

## 2023-05-15 ENCOUNTER — Ambulatory Visit: Payer: BC Managed Care – PPO | Admitting: Professional Counselor
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
## 2023-05-21 ENCOUNTER — Other Ambulatory Visit: Payer: Self-pay | Admitting: Psychiatry

## 2023-05-21 ENCOUNTER — Encounter (INDEPENDENT_AMBULATORY_CARE_PROVIDER_SITE_OTHER): Payer: Self-pay

## 2023-05-22 ENCOUNTER — Ambulatory Visit (INDEPENDENT_AMBULATORY_CARE_PROVIDER_SITE_OTHER): Payer: BC Managed Care – PPO | Admitting: Psychiatry

## 2023-05-22 ENCOUNTER — Encounter: Payer: Self-pay | Admitting: Psychiatry

## 2023-05-22 VITALS — BP 128/86 | HR 82 | Temp 98.1°F | Ht 63.0 in | Wt 181.6 lb

## 2023-05-22 DIAGNOSIS — F4010 Social phobia, unspecified: Secondary | ICD-10-CM | POA: Diagnosis not present

## 2023-05-22 DIAGNOSIS — F0630 Mood disorder due to known physiological condition, unspecified: Secondary | ICD-10-CM | POA: Diagnosis not present

## 2023-05-22 DIAGNOSIS — F1290 Cannabis use, unspecified, uncomplicated: Secondary | ICD-10-CM

## 2023-05-22 DIAGNOSIS — F4310 Post-traumatic stress disorder, unspecified: Secondary | ICD-10-CM | POA: Diagnosis not present

## 2023-05-22 DIAGNOSIS — G4700 Insomnia, unspecified: Secondary | ICD-10-CM

## 2023-05-22 MED ORDER — PRAZOSIN HCL 1 MG PO CAPS: 1.0000 mg | ORAL_CAPSULE | Freq: Every day | ORAL | 0 refills | Status: DC

## 2023-05-22 MED ORDER — TRAZODONE HCL 50 MG PO TABS: 50.0000 mg | ORAL_TABLET | Freq: Every evening | ORAL | 0 refills | Status: DC | PRN

## 2023-05-22 MED ORDER — PRAZOSIN HCL 2 MG PO CAPS: 2.0000 mg | ORAL_CAPSULE | Freq: Every day | ORAL | 1 refills | Status: DC

## 2023-05-22 MED ORDER — ARIPIPRAZOLE 2 MG PO TABS: 2.0000 mg | ORAL_TABLET | Freq: Every day | ORAL | 1 refills | Status: DC

## 2023-05-22 MED ORDER — FLUOXETINE HCL 20 MG PO CAPS: 20.0000 mg | ORAL_CAPSULE | Freq: Every day | ORAL | 0 refills | Status: DC

## 2023-05-22 NOTE — Patient Instructions (Signed)
 Continue lamotrigine 150 mg twice a day Continue Abilify 2 mg at night  Continue fluoxetine 60 mg daily  Start prazosin 1 mg at night for 3 days, then 2 mg at night Continue trazodone 50 mg at night as needed for insomnia Next appointment: 5/19 at 11 am

## 2023-06-09 ENCOUNTER — Ambulatory Visit: Admitting: Professional Counselor

## 2023-06-13 ENCOUNTER — Other Ambulatory Visit: Payer: Self-pay | Admitting: Psychiatry

## 2023-06-16 ENCOUNTER — Other Ambulatory Visit: Payer: Self-pay | Admitting: Psychiatry

## 2023-06-19 ENCOUNTER — Ambulatory Visit (INDEPENDENT_AMBULATORY_CARE_PROVIDER_SITE_OTHER): Admitting: Professional Counselor

## 2023-06-19 DIAGNOSIS — F411 Generalized anxiety disorder: Secondary | ICD-10-CM | POA: Diagnosis not present

## 2023-06-19 DIAGNOSIS — F4310 Post-traumatic stress disorder, unspecified: Secondary | ICD-10-CM | POA: Diagnosis not present

## 2023-06-19 NOTE — Progress Notes (Signed)
  THERAPIST PROGRESS NOTE  Session Time: 9:57 AM - 10:50 AM   Participation Level: Active  Behavioral Response: Well Groomed, Alert, Anxious and Dysphoric  Type of Therapy: Individual Therapy  Treatment Goals addressed: Active OP Depression  LTG: "Everything is just taxing to me. People are taxing. People just cause anger, it's unpleasant, I just feel like crap."                Start:  04/01/23    Expected End:  03/30/24      STG: "In my head and in my heart, I killed two babies." (Abortion/miscarriage) To reduce impact of negative life experiences AEB identifying and restructuring cognitive distortions.    STG: "I get so startled." To reduce symptoms of anxiety AEB utilizing coping mechanisms and engaging in exposure exercises over the next 90 days.     STG: Lanard will identify coping strategies to deal with trauma memories and the associated emotional reaction.   ProgressTowards Goals: Progressing  Interventions: CBT, Motivational Interviewing, and Supportive  Summary: ENO DENECKE is a 63 y.o. male who presents with a history of anxiety, depression, and PTSD. He appeared anxious and dysphoric but oriented x5. He stated he has been more isolative since last session. He also reported one day of relapse on alcohol. Claiborn engaged in discussion about symptoms, treatment options. He was able to mess up his hair briefly and reported it was about a 6 out of 10 distressing. Jawaun continues to think in unhelpful patterns and he agrees this isn't helpful but appears hopeless about his ability to make changes. He discussed wanting to accept how he is but noted it doesn't bring a sense of peace to think about that. He will think about what he is willing to do to make changes.   Therapist Response: Conducted session with Bearl Limes. Began session with check-in/update since previous session. Utilized empathetic and reflective listening. Used open-ended questions to facilitate discussion and  summarized Naoki's thoughts/feelings. Highlighted unhelpful patterns of thinking (black/white thinking, overgeneralization, fortune telling, and mind reading). Used Socratic questioning to challenge negative thinking. Discussed potential for ERP to help overcome fear of judgement. Had Adhvik purposefully mess up his hair and rated SUDS. Encouraged Trigo to consider what he is willing to do to make changes moving forward. Scheduled additional appointment and concluded session.   Suicidal/Homicidal: No  Plan: Return again in 1 week.  Diagnosis: PTSD (post-traumatic stress disorder)  Generalized anxiety disorder  Collaboration of Care: Medication Management AEB chart review  Patient/Guardian was advised Release of Information must be obtained prior to any record release in order to collaborate their care with an outside provider. Patient/Guardian was advised if they have not already done so to contact the registration department to sign all necessary forms in order for us  to release information regarding their care.   Consent: Patient/Guardian gives verbal consent for treatment and assignment of benefits for services provided during this visit. Patient/Guardian expressed understanding and agreed to proceed.   Len Quale, [facility] 06/19/2023
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
## 2023-06-26 ENCOUNTER — Ambulatory Visit (INDEPENDENT_AMBULATORY_CARE_PROVIDER_SITE_OTHER): Admitting: Professional Counselor

## 2023-06-26 DIAGNOSIS — F4010 Social phobia, unspecified: Secondary | ICD-10-CM | POA: Diagnosis not present
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
## 2023-06-30 ENCOUNTER — Other Ambulatory Visit: Payer: Self-pay

## 2023-06-30 ENCOUNTER — Encounter: Payer: Self-pay | Admitting: Psychiatry

## 2023-06-30 ENCOUNTER — Ambulatory Visit (INDEPENDENT_AMBULATORY_CARE_PROVIDER_SITE_OTHER): Admitting: Psychiatry

## 2023-06-30 VITALS — BP 116/80 | HR 78 | Temp 98.0°F | Ht 63.0 in | Wt 180.2 lb

## 2023-06-30 DIAGNOSIS — F0630 Mood disorder due to known physiological condition, unspecified: Secondary | ICD-10-CM

## 2023-06-30 DIAGNOSIS — R632 Polyphagia: Secondary | ICD-10-CM | POA: Diagnosis not present

## 2023-06-30 DIAGNOSIS — G4700 Insomnia, unspecified: Secondary | ICD-10-CM

## 2023-06-30 DIAGNOSIS — F4310 Post-traumatic stress disorder, unspecified: Secondary | ICD-10-CM

## 2023-06-30 DIAGNOSIS — F4010 Social phobia, unspecified: Secondary | ICD-10-CM

## 2023-06-30 MED ORDER — ARIPIPRAZOLE 2 MG PO TABS: 2.0000 mg | ORAL_TABLET | Freq: Every day | ORAL | 0 refills | Status: DC

## 2023-06-30 MED ORDER — FLUOXETINE HCL 40 MG PO CAPS: 40.0000 mg | ORAL_CAPSULE | Freq: Every day | ORAL | 1 refills | Status: DC

## 2023-06-30 MED ORDER — TOPIRAMATE 25 MG PO TABS
25.0000 mg | ORAL_TABLET | Freq: Every day | ORAL | 1 refills | Status: DC
Start: 2023-06-30 — End: 2023-09-18

## 2023-06-30 NOTE — Patient Instructions (Signed)
 Continue lamotrigine 150 mg twice a day Continue Abilify  2 mg at night  Continue fluoxetine  60 mg daily  Continue prazosin   2 mg at night Start topiramate  25 mg at night  Continue trazodone  50 mg at night as needed for insomnia Next appointment: 7/3 at 10 AM

## 2023-07-03 ENCOUNTER — Other Ambulatory Visit: Payer: Self-pay | Admitting: Psychiatry

## 2023-07-03 MED ORDER — METFORMIN HCL 500 MG PO TABS: 500.0000 mg | ORAL_TABLET | Freq: Every evening | ORAL | 1 refills | Status: DC

## 2023-07-25 ENCOUNTER — Other Ambulatory Visit: Payer: Self-pay | Admitting: Psychiatry
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
## 2023-07-28 NOTE — Telephone Encounter (Signed)
 Could you contact him to see if he would be available to come in later today to address his concerns? Thanks.

## 2023-07-29 ENCOUNTER — Encounter: Payer: Self-pay | Admitting: Psychiatry

## 2023-07-29 ENCOUNTER — Ambulatory Visit (INDEPENDENT_AMBULATORY_CARE_PROVIDER_SITE_OTHER): Admitting: Psychiatry

## 2023-07-29 VITALS — BP 118/82 | HR 93 | Temp 98.3°F | Ht 63.0 in | Wt 176.8 lb

## 2023-07-29 DIAGNOSIS — F4010 Social phobia, unspecified: Secondary | ICD-10-CM

## 2023-07-29 DIAGNOSIS — F4310 Post-traumatic stress disorder, unspecified: Secondary | ICD-10-CM | POA: Diagnosis not present

## 2023-07-29 DIAGNOSIS — F0630 Mood disorder due to known physiological condition, unspecified: Secondary | ICD-10-CM

## 2023-07-29 DIAGNOSIS — Z79899 Other long term (current) drug therapy: Secondary | ICD-10-CM | POA: Diagnosis not present

## 2023-07-29 NOTE — Patient Instructions (Signed)
 Continue lamotrigine 150 mg twice a day Continue Abilify  2 mg at night  Continue fluoxetine  60 mg daily  Continue prazosin  2 mg at night Continue trazodone  50 mg at night as needed for insomnia Discontinue metoformin, topiramte Obtain lab (CMP) at [facility] Next appointment: 7/3 at 10 am

## 2023-07-29 NOTE — Telephone Encounter (Signed)
 He had a visit today

## 2023-07-30 ENCOUNTER — Telehealth: Payer: Self-pay

## 2023-07-30 NOTE — Telephone Encounter (Signed)
 mr. Todd Hobbs wanted his labwork order sent to pcp. i call pcp office and they are ok with him having labwork done there. the orders were given to provider nurse. pt has an appt thursday 19th at 10:00. pt has been notified and is ok with appt date and time.

## 2023-08-04 ENCOUNTER — Ambulatory Visit (INDEPENDENT_AMBULATORY_CARE_PROVIDER_SITE_OTHER): Admitting: Professional Counselor

## 2023-08-04 ENCOUNTER — Telehealth: Payer: Self-pay | Admitting: Psychiatry

## 2023-08-04 DIAGNOSIS — F0630 Mood disorder due to known physiological condition, unspecified: Secondary | ICD-10-CM | POA: Diagnosis not present

## 2023-08-04 DIAGNOSIS — F4310 Post-traumatic stress disorder, unspecified: Secondary | ICD-10-CM

## 2023-08-04 DIAGNOSIS — F411 Generalized anxiety disorder: Secondary | ICD-10-CM

## 2023-08-04 DIAGNOSIS — F4010 Social phobia, unspecified: Secondary | ICD-10-CM

## 2023-08-04 NOTE — Telephone Encounter (Signed)
 Patient states he stopped the medication that he had an interaction with. States he is doing well but if need to start medication again, please let him know. Not sure which medication he is referring to.
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
## 2023-08-04 NOTE — Telephone Encounter (Signed)
 I'm glad to hear that he is doing better. Please advise him to hold both topiramate  and metformin  for now, until his next visit.

## 2023-08-05 NOTE — Telephone Encounter (Signed)
 Called patient to inform him to hold the Topiramate  and the Metformin  he stated that he did not pick the medications up from the pharmacy and voiced understanding
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
## 2023-08-14 ENCOUNTER — Other Ambulatory Visit: Payer: Self-pay

## 2023-08-14 ENCOUNTER — Encounter: Payer: Self-pay | Admitting: Psychiatry

## 2023-08-14 ENCOUNTER — Ambulatory Visit (INDEPENDENT_AMBULATORY_CARE_PROVIDER_SITE_OTHER): Admitting: Psychiatry

## 2023-08-14 VITALS — BP 115/78 | HR 90 | Temp 98.4°F | Ht 63.0 in | Wt 178.6 lb

## 2023-08-14 DIAGNOSIS — G4700 Insomnia, unspecified: Secondary | ICD-10-CM

## 2023-08-14 DIAGNOSIS — Z79899 Other long term (current) drug therapy: Secondary | ICD-10-CM

## 2023-08-14 DIAGNOSIS — F4310 Post-traumatic stress disorder, unspecified: Secondary | ICD-10-CM

## 2023-08-14 DIAGNOSIS — F4010 Social phobia, unspecified: Secondary | ICD-10-CM

## 2023-08-14 DIAGNOSIS — F0630 Mood disorder due to known physiological condition, unspecified: Secondary | ICD-10-CM | POA: Diagnosis not present

## 2023-08-14 MED ORDER — TRAZODONE HCL 100 MG PO TABS: 100.0000 mg | ORAL_TABLET | Freq: Every evening | ORAL | 1 refills | Status: DC | PRN

## 2023-08-14 MED ORDER — ARIPIPRAZOLE 5 MG PO TABS: 5.0000 mg | ORAL_TABLET | Freq: Every day | ORAL | 1 refills | Status: DC

## 2023-08-14 MED ORDER — PRAZOSIN HCL 2 MG PO CAPS: 2.0000 mg | ORAL_CAPSULE | Freq: Every day | ORAL | 0 refills | Status: DC

## 2023-08-14 MED ORDER — FLUOXETINE HCL 20 MG PO CAPS: 20.0000 mg | ORAL_CAPSULE | Freq: Every day | ORAL | 0 refills | Status: DC

## 2023-08-14 NOTE — Patient Instructions (Signed)
 Continue lamotrigine 150 mg twice a day Increase Abilify  5 mg at night  Continue fluoxetine  60 mg daily  Continue prazosin  2 mg at night Increase trazodone  100 mg at night as needed for insomnia Next appointment: 8/7 at 1 PM

## 2023-08-17 ENCOUNTER — Other Ambulatory Visit: Payer: Self-pay | Admitting: Psychiatry

## 2023-08-19 ENCOUNTER — Ambulatory Visit (INDEPENDENT_AMBULATORY_CARE_PROVIDER_SITE_OTHER): Admitting: Professional Counselor

## 2023-08-19 DIAGNOSIS — F331 Major depressive disorder, recurrent, moderate: Secondary | ICD-10-CM | POA: Diagnosis not present

## 2023-08-19 DIAGNOSIS — F4010 Social phobia, unspecified: Secondary | ICD-10-CM

## 2023-08-19 DIAGNOSIS — F0630 Mood disorder due to known physiological condition, unspecified: Secondary | ICD-10-CM | POA: Diagnosis not present

## 2023-08-19 DIAGNOSIS — F4310 Post-traumatic stress disorder, unspecified: Secondary | ICD-10-CM | POA: Diagnosis not present

## 2023-08-19 NOTE — Progress Notes (Signed)
  THERAPIST PROGRESS NOTE  Session Time: 10:00 AM - 10:53 AM   Participation Level: Active  Behavioral Response: Well Groomed, Alert, Anxious and Dysphoric  Type of Therapy: Individual Therapy  Treatment Goals addressed: Active OP Depression  LTG: Everything is just taxing to me. People are taxing. People just cause anger, it's unpleasant, I just feel like crap.                Start:  04/01/23    Expected End:  03/30/24      STG: In my head and in my heart, I killed two babies. (Abortion/miscarriage) To reduce impact of negative life experiences AEB identifying and restructuring cognitive distortions.    STG: I get so startled. To reduce symptoms of anxiety AEB utilizing coping mechanisms and engaging in exposure exercises over the next 90 days.     STG: Todd Hobbs will identify coping strategies to deal with trauma memories and the associated emotional reaction.   ProgressTowards Goals: Progressing  Interventions: CBT, Motivational Interviewing, and Supportive  Summary: Todd Hobbs is a 63 y.o. male who presents with a history of anxiety, depression, mood disorder, and PTSD. He appeared somber but oriented x5. He stated things have been better. Todd Hobbs recently sent this writer an email with an error (which was intentional) and noted he has been doing worse since this email. He shared that it was not because of the error, but rather the content of the email. Todd Hobbs continues to believe he must be punished for his part in the loss of pregnancy of two of his babies. He acknowledges there may be errors in his thinking but feels he has thought this way for so long, he cannot change his thinking. He expressed possible fear of what hope and change would bring. He expects it would make him worse. He appeared receptive that he can choose to try though.   Therapist Response: Conducted session with Todd Hobbs. Began session with check-in/update since previous session. Utilized empathetic and  reflective listening. Used open-ended questions to facilitate discussion and summarized thoughts/feelings. Highlighted ongoing pattern of thinking errors and reminded Todd Hobbs that thoughts/feelings are not fact. Normalized fear behind change and time it would take to make significant change. Reminded Todd Hobbs of the progress he has already made with gradual exposure exercises and continuing to practice these will build up to the significant change he would benefit from. Scheduled additional appointment and concluded session.   Suicidal/Homicidal: No  Plan: Return again in 2 weeks.  Diagnosis: PTSD (post-traumatic stress disorder)  Mood disorder in conditions classified elsewhere  Social anxiety disorder  Major depressive disorder, recurrent episode, moderate (HCC)  Collaboration of Care: Medication Management AEB chart review  Patient/Guardian was advised Release of Information must be obtained prior to any record release in order to collaborate their care with an outside provider. Patient/Guardian was advised if they have not already done so to contact the registration department to sign all necessary forms in order for us  to release information regarding their care.   Consent: Patient/Guardian gives verbal consent for treatment and assignment of benefits for services provided during this visit. Patient/Guardian expressed understanding and agreed to proceed.   Todd Hobbs, [facility] 08/19/2023

## 2023-08-26 ENCOUNTER — Other Ambulatory Visit: Payer: Self-pay | Admitting: Psychiatry

## 2023-09-10 ENCOUNTER — Other Ambulatory Visit: Payer: Self-pay | Admitting: Psychiatry
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
## 2023-09-17 ENCOUNTER — Ambulatory Visit (INDEPENDENT_AMBULATORY_CARE_PROVIDER_SITE_OTHER): Admitting: Professional Counselor

## 2023-09-17 DIAGNOSIS — F4010 Social phobia, unspecified: Secondary | ICD-10-CM | POA: Diagnosis not present

## 2023-09-17 DIAGNOSIS — F0630 Mood disorder due to known physiological condition, unspecified: Secondary | ICD-10-CM | POA: Diagnosis not present

## 2023-09-17 DIAGNOSIS — F4310 Post-traumatic stress disorder, unspecified: Secondary | ICD-10-CM | POA: Diagnosis not present
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
## 2023-09-18 ENCOUNTER — Encounter: Payer: Self-pay | Admitting: Psychiatry

## 2023-09-18 ENCOUNTER — Ambulatory Visit (INDEPENDENT_AMBULATORY_CARE_PROVIDER_SITE_OTHER): Admitting: Psychiatry

## 2023-09-18 ENCOUNTER — Other Ambulatory Visit: Payer: Self-pay

## 2023-09-18 VITALS — BP 123/77 | HR 81 | Temp 98.2°F | Ht 63.0 in | Wt 180.8 lb

## 2023-09-18 DIAGNOSIS — F4310 Post-traumatic stress disorder, unspecified: Secondary | ICD-10-CM | POA: Diagnosis not present

## 2023-09-18 DIAGNOSIS — F4010 Social phobia, unspecified: Secondary | ICD-10-CM

## 2023-09-18 DIAGNOSIS — G4700 Insomnia, unspecified: Secondary | ICD-10-CM

## 2023-09-18 DIAGNOSIS — F0630 Mood disorder due to known physiological condition, unspecified: Secondary | ICD-10-CM

## 2023-09-18 MED ORDER — PRAZOSIN HCL 2 MG PO CAPS: 4.0000 mg | ORAL_CAPSULE | Freq: Every day | ORAL | 0 refills | Status: DC

## 2023-09-18 NOTE — Patient Instructions (Signed)
 Continue lamotrigine 150 mg twice a day Continue Abilify  5 mg at night  Continue fluoxetine  60 mg daily  Increase prazosin  4 mg at night Decrease trazodone  50 mg at night as needed for insomnia Next appointment: 9/25 at 10:30

## 2023-09-30 ENCOUNTER — Ambulatory Visit (INDEPENDENT_AMBULATORY_CARE_PROVIDER_SITE_OTHER): Admitting: Professional Counselor

## 2023-09-30 DIAGNOSIS — F0630 Mood disorder due to known physiological condition, unspecified: Secondary | ICD-10-CM

## 2023-09-30 DIAGNOSIS — F4310 Post-traumatic stress disorder, unspecified: Secondary | ICD-10-CM

## 2023-09-30 DIAGNOSIS — G4700 Insomnia, unspecified: Secondary | ICD-10-CM

## 2023-09-30 DIAGNOSIS — F331 Major depressive disorder, recurrent, moderate: Secondary | ICD-10-CM

## 2023-09-30 DIAGNOSIS — F4010 Social phobia, unspecified: Secondary | ICD-10-CM
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
## 2023-10-15 ENCOUNTER — Ambulatory Visit (INDEPENDENT_AMBULATORY_CARE_PROVIDER_SITE_OTHER): Admitting: Professional Counselor

## 2023-10-15 DIAGNOSIS — F4310 Post-traumatic stress disorder, unspecified: Secondary | ICD-10-CM | POA: Diagnosis not present

## 2023-10-15 DIAGNOSIS — F331 Major depressive disorder, recurrent, moderate: Secondary | ICD-10-CM | POA: Diagnosis not present

## 2023-10-15 DIAGNOSIS — F4010 Social phobia, unspecified: Secondary | ICD-10-CM

## 2023-10-15 DIAGNOSIS — F0630 Mood disorder due to known physiological condition, unspecified: Secondary | ICD-10-CM | POA: Diagnosis not present

## 2023-10-15 NOTE — Progress Notes (Signed)
 THERAPIST PROGRESS NOTE  Session Time: 11:01 AM - 11:51 AM   Participation Level: Active  Behavioral Response: Well Groomed, Alert, Anxious  Type of Therapy: Individual Therapy  Treatment Goals addressed: Active OP Depression  LTG: Everything is just taxing to me. People are taxing. People just cause anger, it's unpleasant, I just feel like crap. (Progressing)                Start:  04/01/23    Expected End:  03/30/24      Goal Note Reviewed 09/30/23 Probably in ways I don't see. I am getting something out of this stuff. Even if it's not consciously.     Revised I would just like to accept myself.     STG: In my head and in my heart, I killed two babies. (Abortion/miscarriage) To reduce impact of negative life experiences AEB identifying and restructuring cognitive distortions. (Not Progressing)  Goal Note Reviewed 09/30/23 It used to be out of sight, out of mind. It used to not bother me, but that option is not there anymore. The rational part, I didn't, but the rational part is very small part of me. But I definitely feel it.   STG: I get so startled. To reduce symptoms of anxiety AEB utilizing coping mechanisms and engaging in exposure exercises over the next 90 days.  (Progressing)  Goal Note Reviewed 09/30/23 I'm still having issues of hyper-focusing where my attention is so focused on one thing, it's been more than one time someone will walk right up to me and I don't even see them. I'm always on pins and needles waiting on things to happen. Todd Hobbs has completed some exposure exercises and they have been successful at times.   STG: Todd Hobbs will identify coping strategies to deal with trauma memories and the associated emotional reaction. (Progressing)   Goal Note Reviewed 09/30/23 I'm still gonna go with the out of sight, out of mind. Try to anyway. The remnants of things that happened are still there. I don't feel like a lot of what happened to me was my fault. But that  really doesn't change a lot. It doesn't make anything better. I don't blame myself.   ProgressTowards Goals: Progressing  Interventions: CBT, Motivational Interviewing, and Supportive  Summary: Todd Hobbs is a 63 y.o. Hobbs who presents with a history of anxiety, depression, mood disorder, and trauma. He appeared alert and oriented x5. He brought in a notebook that contained therapy homework from a previous therapist. [name] agreed that much of this homework contained similar to thoughts/beliefs he holds today. He is aware that he can potentially change those thoughts but is still uncertain if he is capable or even willing to. Todd Hobbs engaged in discussion about potential exposure exercises to reduce his anxiety levels rather than reinforcing them with avoidance. He took a copy of urge surfing skill to help reduce his safety behaviors.   Therapist Response: Conducted session with Todd Hobbs. Began session with check-in/update since previous session. Utilized empathetic and reflective listening. Used open-ended questions to facilitate discussion and summarized Todd Hobbs's thoughts/feelings. Explored Todd Hobbs's motivation to change and potential benefits from doing things differently. Discussed exposure exercises and provided urge surfing handout to help with reducing safety behaviors. Reminded Todd Hobbs that changes don't need to be big, they just need to be consistent and build up over time. Scheduled additional appointment and concluded session.   Suicidal/Homicidal: No  Plan: Return again in 2 weeks.  Diagnosis: Social anxiety disorder  Mood disorder in conditions  classified elsewhere  PTSD (post-traumatic stress disorder)  Major depressive disorder, recurrent episode, moderate (HCC)  Collaboration of Care: Medication Management AEB chart review  Patient/Guardian was advised Release of Information must be obtained prior to any record release in order to collaborate their care with an outside  provider. Patient/Guardian was advised if they have not already done so to contact the registration department to sign all necessary forms in order for us  to release information regarding their care.   Consent: Patient/Guardian gives verbal consent for treatment and assignment of benefits for services provided during this visit. Patient/Guardian expressed understanding and agreed to proceed.   Todd Hobbs, [facility] 10/15/2023

## 2023-10-21 ENCOUNTER — Other Ambulatory Visit: Payer: Self-pay | Admitting: Psychiatry

## 2023-10-27 ENCOUNTER — Other Ambulatory Visit: Payer: Self-pay | Admitting: Gastroenterology

## 2023-10-27 DIAGNOSIS — R748 Abnormal levels of other serum enzymes: Secondary | ICD-10-CM

## 2023-10-29 ENCOUNTER — Ambulatory Visit (INDEPENDENT_AMBULATORY_CARE_PROVIDER_SITE_OTHER): Admitting: Professional Counselor

## 2023-10-29 DIAGNOSIS — F331 Major depressive disorder, recurrent, moderate: Secondary | ICD-10-CM

## 2023-10-29 DIAGNOSIS — F4310 Post-traumatic stress disorder, unspecified: Secondary | ICD-10-CM | POA: Diagnosis not present

## 2023-10-29 DIAGNOSIS — F4010 Social phobia, unspecified: Secondary | ICD-10-CM

## 2023-10-29 DIAGNOSIS — F1290 Cannabis use, unspecified, uncomplicated: Secondary | ICD-10-CM | POA: Diagnosis not present

## 2023-10-29 NOTE — Progress Notes (Signed)
 THERAPIST PROGRESS NOTE  Session Time: 11:05 AM - 11:55 AM   Participation Level: Active  Behavioral Response: Well Groomed, Alert, Dysphoric  Type of Therapy: Individual Therapy  Treatment Goals addressed: Active OP Depression  LTG: Everything is just taxing to me. People are taxing. People just cause anger, it's unpleasant, I just feel like crap. (Progressing)                Start:  04/01/23    Expected End:  03/30/24      Goal Note Reviewed 09/30/23 Probably in ways I don't see. I am getting something out of this stuff. Even if it's not consciously.     Revised I would just like to accept myself.     STG: In my head and in my heart, I killed two babies. (Abortion/miscarriage) To reduce impact of negative life experiences AEB identifying and restructuring cognitive distortions. (Not Progressing)  Goal Note Reviewed 09/30/23 It used to be out of sight, out of mind. It used to not bother me, but that option is not there anymore. The rational part, I didn't, but the rational part is very small part of me. But I definitely feel it.   STG: I get so startled. To reduce symptoms of anxiety AEB utilizing coping mechanisms and engaging in exposure exercises over the next 90 days.  (Progressing)  Goal Note Reviewed 09/30/23 I'm still having issues of hyper-focusing where my attention is so focused on one thing, it's been more than one time someone will walk right up to me and I don't even see them. I'm always on pins and needles waiting on things to happen. Todd Hobbs has completed some exposure exercises and they have been successful at times.   STG: Todd Hobbs will identify coping strategies to deal with trauma memories and the associated emotional reaction. (Progressing)   Goal Note Reviewed 09/30/23 I'm still gonna go with the out of sight, out of mind. Try to anyway. The remnants of things that happened are still there. I don't feel like a lot of what happened to me was my fault. But  that really doesn't change a lot. It doesn't make anything better. I don't blame myself.   ProgressTowards Goals: Progressing  Interventions: CBT, Motivational Interviewing, and Supportive  Summary: Todd Hobbs is a 63 y.o. male who presents with a history of anxiety, depression, mood disorder, and trauma. He appeared somber but oriented x5. He reported he recently had a colonoscopy and they noted a concern with his liver. This is scheduled for tomorrow. Todd Hobbs stated he is not worried about the test or his liver; in fact he is hopeful in some ways that it could give him a reason to drink or end his life sooner. He is anxious of going to an unknown location though. He plans to leave early to get there. Todd Hobbs discussed other thoughts and feelings about things like work and continues to note he would rather stay comfortable than trying to do things differently.  Therapist Response: Conducted session with Todd Hobbs. Began session with check-in/update since previous session. Utilized empathetic and reflective listening. Used open-ended questions to facilitate discussion and summarized Todd Hobbs's thoughts/feelings. Encouraged other ways of looking at things to reduce fortune telling or mind reading distortions. Scheduled additional appointment and concluded session.   Suicidal/Homicidal: No  Plan: Return again in 2 weeks.  Diagnosis: Social anxiety disorder  PTSD (post-traumatic stress disorder)  Major depressive disorder, recurrent episode, moderate (HCC)  Marijuana use, continuous  Collaboration of Care: Medication  Management AEB chart review  Patient/Guardian was advised Release of Information must be obtained prior to any record release in order to collaborate their care with an outside provider. Patient/Guardian was advised if they have not already done so to contact the registration department to sign all necessary forms in order for us  to release information regarding their care.    Consent: Patient/Guardian gives verbal consent for treatment and assignment of benefits for services provided during this visit. Patient/Guardian expressed understanding and agreed to proceed.   Todd Hobbs, [facility] 10/29/2023

## 2023-10-30 ENCOUNTER — Ambulatory Visit
Admission: RE | Admit: 2023-10-30 | Discharge: 2023-10-30 | Disposition: A | Discharge status: Home / Self Care | Source: Ambulatory Visit | Attending: Gastroenterology | Admitting: Gastroenterology

## 2023-10-30 DIAGNOSIS — R748 Abnormal levels of other serum enzymes: Secondary | ICD-10-CM | POA: Insufficient documentation
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
## 2023-11-03 ENCOUNTER — Encounter: Payer: Self-pay | Admitting: *Deleted

## 2023-11-06 ENCOUNTER — Encounter: Payer: Self-pay | Admitting: Psychiatry

## 2023-11-06 ENCOUNTER — Ambulatory Visit (INDEPENDENT_AMBULATORY_CARE_PROVIDER_SITE_OTHER): Admitting: Psychiatry

## 2023-11-06 ENCOUNTER — Other Ambulatory Visit: Payer: Self-pay

## 2023-11-06 VITALS — BP 113/75 | HR 81 | Temp 97.4°F | Ht 63.0 in | Wt 180.2 lb

## 2023-11-06 DIAGNOSIS — F0630 Mood disorder due to known physiological condition, unspecified: Secondary | ICD-10-CM | POA: Diagnosis not present

## 2023-11-06 DIAGNOSIS — G4700 Insomnia, unspecified: Secondary | ICD-10-CM | POA: Diagnosis not present

## 2023-11-06 DIAGNOSIS — F4310 Post-traumatic stress disorder, unspecified: Secondary | ICD-10-CM | POA: Diagnosis not present

## 2023-11-06 DIAGNOSIS — F4010 Social phobia, unspecified: Secondary | ICD-10-CM | POA: Diagnosis not present

## 2023-11-06 MED ORDER — VENLAFAXINE HCL ER 150 MG PO CP24: 150.0000 mg | ORAL_CAPSULE | Freq: Every day | ORAL | 1 refills | Status: DC

## 2023-11-06 MED ORDER — VENLAFAXINE HCL ER 75 MG PO CP24: 75.0000 mg | ORAL_CAPSULE | Freq: Every day | ORAL | 0 refills | Status: DC

## 2023-11-06 MED ORDER — ARIPIPRAZOLE 5 MG PO TABS: 5.0000 mg | ORAL_TABLET | Freq: Every day | ORAL | 0 refills | Status: DC

## 2023-11-06 MED ORDER — PRAZOSIN HCL 2 MG PO CAPS: 4.0000 mg | ORAL_CAPSULE | Freq: Every day | ORAL | 0 refills | Status: AC

## 2023-11-06 NOTE — Patient Instructions (Signed)
 Continue lamotrigine 150 mg twice a day Decrease fluoxetine  40 mg daily for one week, then 20 mg daily for one week, then discontinue Start venlafaxine  75 mg daily for one week (along with fluoxetine  20 mg) then 150 mg daily  Continue Abilify  5 mg at night  Continue prazosin  4 mg at night Continue trazodone  50 mg at night as needed for insomnia Referral for sleep evaluation  Next appointment: 11/17 at 10:30

## 2023-11-12 ENCOUNTER — Ambulatory Visit (INDEPENDENT_AMBULATORY_CARE_PROVIDER_SITE_OTHER): Admitting: Professional Counselor

## 2023-11-12 DIAGNOSIS — F4010 Social phobia, unspecified: Secondary | ICD-10-CM

## 2023-11-12 DIAGNOSIS — F4310 Post-traumatic stress disorder, unspecified: Secondary | ICD-10-CM | POA: Diagnosis not present

## 2023-11-12 DIAGNOSIS — F331 Major depressive disorder, recurrent, moderate: Secondary | ICD-10-CM | POA: Diagnosis not present

## 2023-11-12 NOTE — Progress Notes (Unsigned)
  THERAPIST PROGRESS NOTE  Session Time: 11:05 AM - 11:53 AM  Participation Level: Active  Behavioral Response: Well GroomedAlertDysphoric  Type of Therapy: Individual Therapy  Treatment Goals addressed: ***  ProgressTowards Goals: Progressing  Interventions: {CHL AMB BH Type of Intervention:[id number]}  Summary: Todd Hobbs is a 63 y.o. male who presents with ***.   Suicidal/Homicidal: Yeswithout intent/plan  Therapist Response: Conducted session with . Began session with check-in/update since previous session. Utilized empathetic and reflective listening. Used open-ended questions to facilitate discussion and summarized thoughts/feelings. Scheduled additional appointment and concluded session.   Plan: Return again in *** weeks.  Diagnosis: No diagnosis found.  Collaboration of Care: Medication Management AEB chart review  Patient/Guardian was advised Release of Information must be obtained prior to any record release in order to collaborate their care with an outside provider. Patient/Guardian was advised if they have not already done so to contact the registration department to sign all necessary forms in order for us  to release information regarding their care.   Consent: Patient/Guardian gives verbal consent for treatment and assignment of benefits for services provided during this visit. Patient/Guardian expressed understanding and agreed to proceed.   [name], [facility] 11/12/2023

## 2023-11-13 ENCOUNTER — Other Ambulatory Visit: Payer: Self-pay | Admitting: Psychiatry

## 2023-11-18 ENCOUNTER — Ambulatory Visit: Admitting: General Practice

## 2023-11-18 ENCOUNTER — Encounter
Admission: RE | Disposition: A | Discharge status: Home / Self Care | Payer: Self-pay | Source: Home / Self Care | Attending: Gastroenterology

## 2023-11-18 ENCOUNTER — Ambulatory Visit
Admission: RE | Admit: 2023-11-18 | Discharge: 2023-11-18 | Disposition: A | Discharge status: Home / Self Care | Attending: Gastroenterology | Admitting: Gastroenterology

## 2023-11-18 DIAGNOSIS — Z87891 Personal history of nicotine dependence: Secondary | ICD-10-CM | POA: Diagnosis not present

## 2023-11-18 DIAGNOSIS — J449 Chronic obstructive pulmonary disease, unspecified: Secondary | ICD-10-CM | POA: Diagnosis not present

## 2023-11-18 DIAGNOSIS — K219 Gastro-esophageal reflux disease without esophagitis: Secondary | ICD-10-CM | POA: Diagnosis not present

## 2023-11-18 DIAGNOSIS — Z1211 Encounter for screening for malignant neoplasm of colon: Secondary | ICD-10-CM | POA: Diagnosis present

## 2023-11-18 DIAGNOSIS — K641 Second degree hemorrhoids: Secondary | ICD-10-CM | POA: Insufficient documentation

## 2023-11-18 DIAGNOSIS — Z7984 Long term (current) use of oral hypoglycemic drugs: Secondary | ICD-10-CM | POA: Diagnosis not present

## 2023-11-18 DIAGNOSIS — D123 Benign neoplasm of transverse colon: Secondary | ICD-10-CM | POA: Diagnosis not present

## 2023-11-18 DIAGNOSIS — F419 Anxiety disorder, unspecified: Secondary | ICD-10-CM | POA: Insufficient documentation

## 2023-11-18 DIAGNOSIS — Z79899 Other long term (current) drug therapy: Secondary | ICD-10-CM | POA: Diagnosis not present

## 2023-11-18 DIAGNOSIS — Z791 Long term (current) use of non-steroidal anti-inflammatories (NSAID): Secondary | ICD-10-CM | POA: Insufficient documentation

## 2023-11-18 DIAGNOSIS — D122 Benign neoplasm of ascending colon: Secondary | ICD-10-CM | POA: Insufficient documentation

## 2023-11-18 DIAGNOSIS — F319 Bipolar disorder, unspecified: Secondary | ICD-10-CM | POA: Diagnosis not present

## 2023-11-18 HISTORY — DX: Atherosclerosis of aorta: I70.0

## 2023-11-18 HISTORY — PX: POLYPECTOMY: SHX149

## 2023-11-18 HISTORY — PX: COLONOSCOPY: SHX5424

## 2023-11-18 SURGERY — COLONOSCOPY
Anesthesia: General

## 2023-11-18 MED ORDER — DEXMEDETOMIDINE HCL IN NACL 80 MCG/20ML IV SOLN
INTRAVENOUS | Status: AC
  Filled 2023-11-18: qty 20

## 2023-11-18 MED ORDER — PROPOFOL 500 MG/50ML IV EMUL
INTRAVENOUS | Status: DC | PRN
Start: 2023-11-18 — End: 2023-11-18
  Administered 2023-11-18: 75 ug/kg/min via INTRAVENOUS

## 2023-11-18 MED ORDER — LIDOCAINE HCL (PF) 2 % IJ SOLN
INTRAMUSCULAR | Status: AC
  Filled 2023-11-18: qty 5

## 2023-11-18 MED ORDER — SODIUM CHLORIDE 0.9 % IV SOLN: INTRAVENOUS | Status: DC

## 2023-11-18 MED ORDER — PROPOFOL 1000 MG/100ML IV EMUL
INTRAVENOUS | Status: AC
  Filled 2023-11-18: qty 100

## 2023-11-18 MED ORDER — LIDOCAINE HCL (CARDIAC) PF 100 MG/5ML IV SOSY
PREFILLED_SYRINGE | INTRAVENOUS | Status: DC | PRN
  Administered 2023-11-18: 60 mg via INTRAVENOUS

## 2023-11-18 MED ORDER — DEXMEDETOMIDINE HCL IN NACL 80 MCG/20ML IV SOLN
INTRAVENOUS | Status: DC | PRN
  Administered 2023-11-18: 8 ug via INTRAVENOUS
  Administered 2023-11-18: 12 ug via INTRAVENOUS

## 2023-11-18 MED ORDER — PROPOFOL 10 MG/ML IV BOLUS
INTRAVENOUS | Status: DC | PRN
  Administered 2023-11-18 (×2): 50 mg via INTRAVENOUS

## 2023-11-18 NOTE — Anesthesia Preprocedure Evaluation (Signed)
 Anesthesia Evaluation  Patient identified by MRN, date of birth, ID band Patient awake    Reviewed: Allergy & Precautions, NPO status , Patient's Chart, lab work & pertinent test results  Airway Mallampati: III  TM Distance: >3 FB Neck ROM: full    Dental  (+) Teeth Intact, Dental Advidsory Given   Pulmonary neg pulmonary ROS, COPD,  COPD inhaler, former smoker   Pulmonary exam normal  + decreased breath sounds      Cardiovascular Exercise Tolerance: Poor negative cardio ROS Normal cardiovascular exam Rhythm:Regular     Neuro/Psych   Anxiety Depression Bipolar Disorder   negative neurological ROS  negative psych ROS   GI/Hepatic Neg liver ROS,GERD  Medicated and Controlled,,  Endo/Other  negative endocrine ROS    Renal/GU      Musculoskeletal   Abdominal   Peds  Hematology negative hematology ROS (+)   Anesthesia Other Findings Past Medical History: No date: Anxiety No date: Arthritis     Comment:  ankles No date: Bipolar 1 disorder (HCC) No date: Cataract cortical, senile No date: COPD (chronic obstructive pulmonary disease) (HCC) No date: Depression No date: Gout No date: History of shingles No date: HLD (hyperlipidemia) No date: Knee pain, right     Comment:  wears brace No date: Pneumonia No date: PTSD (post-traumatic stress disorder) No date: Pulmonary fibrosis (HCC) 10/2022: Right inguinal hernia No date: Vertigo     Comment:  last episode approx 1/17  Past Surgical History: No date: CATARACT EXTRACTION W/ INTRAOCULAR LENS  IMPLANT, BILATERAL 2020: CHEILECTOMY; Bilateral No date: COLONOSCOPY     Comment:  2015, 2018 06/08/2015: EXCISION PARTIAL PHALANX; Right     Comment:  Procedure: EXCISION BONE PHALANX RIGHTT AND LEFTT 5TH               TOES---Resection of proximal phalanx head and the lateral              portion the middle distal phalanges right fifth toe               Resection of  proximal phalanx head and lateral portion of              the middle and distal phalanges left fifth toe ;                Surgeon: Donnice Cory, DPM;  Location: [facility];  Service: Podiatry;  Laterality: Right;  LOCAL WITH              IVA 1982: TONSILLECTOMY AND ADENOIDECTOMY 07/24/2016: TRANSURETHRAL RESECTION OF BLADDER TUMOR; N/A     Comment:  Procedure: TRANSURETHRAL RESECTION OF BLADDER TUMOR               (TURBT) (SMALL);  Surgeon: Chauncey Redell Agent, MD;                Location: [facility];  Service: Urology;  Laterality: N/A; No date: WISDOM TOOTH EXTRACTION  BMI    Body Mass Index: 28.34 kg/m      Reproductive/Obstetrics negative OB ROS                              Anesthesia Physical Anesthesia Plan  ASA: 3  Anesthesia Plan: General   Post-op Pain Management: Minimal or no pain anticipated   Induction: Intravenous  PONV Risk Score and Plan:  2 and Propofol  infusion and TIVA  Airway Management Planned: Nasal Cannula  Additional Equipment: None  Intra-op Plan:   Post-operative Plan:   Informed Consent: I have reviewed the patients History and Physical, chart, labs and discussed the procedure including the risks, benefits and alternatives for the proposed anesthesia with the patient or authorized representative who has indicated his/her understanding and acceptance.     Dental advisory given  Plan Discussed with: CRNA and Surgeon  Anesthesia Plan Comments: (Discussed risks of anesthesia with patient, including possibility of difficulty with spontaneous ventilation under anesthesia necessitating airway intervention, PONV, and rare risks such as cardiac or respiratory or neurological events, and allergic reactions. Discussed the role of CRNA in patient's perioperative care. Patient understands.)        Anesthesia Quick Evaluation

## 2023-11-18 NOTE — H&P (Signed)
 Outpatient short stay form Pre-procedure 11/18/2023  Todd ONEIDA Schick, MD  Primary Physician: Lenon Layman ORN, MD  Reason for visit:  Surveillance  History of present illness:    63 y/o gentleman with history of bipolar, marijuana use, and arthritis here for surveillance colonoscopy. Last colonoscopy in 2015 with small Ta's. No blood thinners. History of right inguinal repair.    Current Facility-Administered Medications:    0.9 %  sodium chloride infusion, , Intravenous, Continuous, Dashae Wilcher, Todd ONEIDA, MD, Last Rate: 20 mL/hr at 11/18/23 0716, New Bag at 11/18/23 0716  Medications Prior to Admission  Medication Sig Dispense Refill Last Dose/Taking   [START ON 12/09/2023] ARIPiprazole  (ABILIFY ) 5 MG tablet Take 1 tablet (5 mg total) by mouth daily. 90 tablet 0 Past Week   celecoxib  (CELEBREX ) 100 MG capsule Take 100 mg by mouth daily.   Past Week   cyclobenzaprine (FLEXERIL) 5 MG tablet Take 5 mg by mouth 3 (three) times daily as needed.   Past Week   FLUoxetine  (PROZAC ) 40 MG capsule Take 1 capsule (40 mg total) by mouth daily. 90 capsule 1 11/17/2023   Ipratropium-Albuterol (COMBIVENT) 20-100 MCG/ACT AERS respimat Inhale into the lungs.   11/17/2023   metFORMIN  (GLUCOPHAGE ) 500 MG tablet Take 1 tablet (500 mg total) by mouth every evening. 90 tablet 0 Past Week   pantoprazole (PROTONIX) 40 MG tablet Take by mouth.   Past Week   [START ON 12/17/2023] prazosin  (MINIPRESS ) 2 MG capsule Take 2 capsules (4 mg total) by mouth at bedtime. 180 capsule 0 Past Week   venlafaxine  XR (EFFEXOR -XR) 150 MG 24 hr capsule Take 1 capsule (150 mg total) by mouth daily with breakfast. Start after completing 75 mg daily for one week 30 capsule 1 Past Week   atorvastatin (LIPITOR) 40 MG tablet Take 40 mg by mouth at bedtime.      FLUoxetine  (PROZAC ) 20 MG capsule Take 1 capsule (20 mg total) by mouth daily. Total of 60 mg daily. Take along with 40 mg cap (Patient not taking: Reported on 11/06/2023) 90  capsule 0    HYDROcodone -acetaminophen  (NORCO) 5-325 MG tablet Take 1 tablet by mouth every 6 (six) hours as needed for up to 6 doses for moderate pain. (Patient not taking: Reported on 11/18/2023) 6 tablet 0 Not Taking   lamoTRIgine (LAMICTAL) 150 MG tablet Take 150 mg by mouth 2 (two) times daily.      traZODone  (DESYREL ) 100 MG tablet Take 1 tablet (100 mg total) by mouth at bedtime as needed for sleep. (Patient taking differently: Take 50 mg by mouth at bedtime as needed for sleep.) 90 tablet 0    venlafaxine  XR (EFFEXOR -XR) 75 MG 24 hr capsule Take 1 capsule (75 mg total) by mouth daily with breakfast for 7 days. 7 capsule 0      No Known Allergies   Past Medical History:  Diagnosis Date   Anxiety    Aortic atherosclerosis    Arthritis    ankles   Bipolar 1 disorder (HCC)    Cataract cortical, senile    COPD (chronic obstructive pulmonary disease) (HCC)    Depression    Gout    History of shingles    HLD (hyperlipidemia)    Knee pain, right    wears brace   Pneumonia    PTSD (post-traumatic stress disorder)    Pulmonary fibrosis (HCC)    Right inguinal hernia 10/2022   Vertigo    last episode approx 1/17    Review of  systems:  Otherwise negative.    Physical Exam  Gen: Alert, oriented. Appears stated age.  HEENT: PERRLA. Lungs: No respiratory distress CV: RRR Abd: soft, benign, no masses Ext: No edema    Planned procedures: Proceed with colonoscopy. The patient understands the nature of the planned procedure, indications, risks, alternatives and potential complications including but not limited to bleeding, infection, perforation, damage to internal organs and possible oversedation/side effects from anesthesia. The patient agrees and gives consent to proceed.  Please refer to procedure notes for findings, recommendations and patient disposition/instructions.     Todd ONEIDA Schick, MD [facility]

## 2023-11-18 NOTE — Interval H&P Note (Signed)
 History and Physical Interval Note:  11/18/2023 7:44 AM  Todd Hobbs  has presented today for surgery, with the diagnosis of History of colon polyps [Z86.0100].  The various methods of treatment have been discussed with the patient and family. After consideration of risks, benefits and other options for treatment, the patient has consented to  Procedure(s): COLONOSCOPY (N/A) as a surgical intervention.  The patient's history has been reviewed, patient examined, no change in status, stable for surgery.  I have reviewed the patient's chart and labs.  Questions were answered to the patient's satisfaction.     Todd Hobbs  Ok to proceed with colonoscopy

## 2023-11-18 NOTE — Anesthesia Postprocedure Evaluation (Signed)
 Anesthesia Post Note  Patient: Todd Hobbs  Procedure(s) Performed: COLONOSCOPY POLYPECTOMY, INTESTINE  Patient location during evaluation: Endoscopy Anesthesia Type: General Level of consciousness: awake and alert Pain management: pain level controlled Vital Signs Assessment: post-procedure vital signs reviewed and stable Respiratory status: spontaneous breathing, nonlabored ventilation, respiratory function stable and patient connected to nasal cannula oxygen Cardiovascular status: blood pressure returned to baseline and stable Postop Assessment: no apparent nausea or vomiting Anesthetic complications: no   No notable events documented.   Last Vitals:  Vitals:   11/18/23 0827 11/18/23 0837  BP: 126/82 127/85  Pulse: 71 66  Resp: 13 15  Temp:    SpO2: 97% 98%    Last Pain:  Vitals:   11/18/23 0837  TempSrc:   PainSc: 0-No pain                 Debby Mines

## 2023-11-18 NOTE — Transfer of Care (Signed)
 Immediate Anesthesia Transfer of Care Note  Patient: Todd Hobbs  Procedure(s) Performed: COLONOSCOPY POLYPECTOMY, INTESTINE  Patient Location: PACU  Anesthesia Type:General  Level of Consciousness: sedated  Airway & Oxygen Therapy: Patient Spontanous Breathing  Post-op Assessment: Report given to RN and Post -op Vital signs reviewed and stable  Post vital signs: Reviewed and stable  Last Vitals:  Vitals Value Taken Time  BP 111/84 11/18/23 08:17  Temp    Pulse 72 11/18/23 08:18  Resp 15 11/18/23 08:18  SpO2 98 % 11/18/23 08:18  Vitals shown include unfiled device data.  Last Pain:  Vitals:   11/18/23 0706  TempSrc: Temporal         Complications: No notable events documented.

## 2023-11-18 NOTE — Op Note (Signed)
 [facility] Patient Name: Todd Hobbs Procedure Date: 11/18/2023 7:46 AM MRN: [id number] Account #: [id number] Date of Birth: 1960/08/08 Admit Type: Outpatient Age: 63 Room: [facility] ENDO ROOM 1 Gender: Male Note Status: Finalized Instrument Name: Colon Scope [id number] Procedure:             Colonoscopy Indications:           Surveillance: Personal history of adenomatous polyps                         on last colonoscopy > 5 years ago Providers:             Ole Schick MD, MD Referring MD:          Layman ORN. Lenon MD, MD (Referring MD) Medicines:             Monitored Anesthesia Care Complications:         No immediate complications. Estimated blood loss:                         Minimal. Procedure:             Pre-Anesthesia Assessment:                        - Prior to the procedure, a History and Physical was                         performed, and patient medications and allergies were                         reviewed. The patient is competent. The risks and                         benefits of the procedure and the sedation options and                         risks were discussed with the patient. All questions                         were answered and informed consent was obtained.                         Patient identification and proposed procedure were                         verified by the physician, the nurse, the                         anesthesiologist, the anesthetist and the technician                         in the endoscopy suite. Mental Status Examination:                         alert and oriented. Airway Examination: normal                         oropharyngeal airway and neck mobility. Respiratory  Examination: clear to auscultation. CV Examination:                         normal. Prophylactic Antibiotics: The patient does not                         require prophylactic antibiotics. Prior                          Anticoagulants: The patient has taken no anticoagulant                         or antiplatelet agents. ASA Grade Assessment: III - A                         patient with severe systemic disease. After reviewing                         the risks and benefits, the patient was deemed in                         satisfactory condition to undergo the procedure. The                         anesthesia plan was to use monitored anesthesia care                         (MAC). Immediately prior to administration of                         medications, the patient was re-assessed for adequacy                         to receive sedatives. The heart rate, respiratory                         rate, oxygen saturations, blood pressure, adequacy of                         pulmonary ventilation, and response to care were                         monitored throughout the procedure. The physical                         status of the patient was re-assessed after the                         procedure.                        After obtaining informed consent, the colonoscope was                         passed under direct vision. Throughout the procedure,                         the patient's blood pressure, pulse, and oxygen  saturations were monitored continuously. The                         Colonoscope was introduced through the anus and                         advanced to the the terminal ileum, with                         identification of the appendiceal orifice and IC                         valve. The colonoscopy was performed without                         difficulty. The patient tolerated the procedure well.                         The quality of the bowel preparation was adequate to                         identify polyps. The terminal ileum, ileocecal valve,                         appendiceal orifice, and rectum were photographed. Findings:      The perianal  and digital rectal examinations were normal.      The terminal ileum appeared normal.      A 3 mm polyp was found in the ascending colon. The polyp was sessile.       The polyp was removed with a cold snare. Resection and retrieval were       complete. Estimated blood loss was minimal.      A 2 mm polyp was found in the transverse colon. The polyp was sessile.       The polyp was removed with a cold snare. Resection and retrieval were       complete. Estimated blood loss was minimal.      Internal hemorrhoids were found during retroflexion. The hemorrhoids       were Grade II (internal hemorrhoids that prolapse but reduce       spontaneously).      The exam was otherwise without abnormality on direct and retroflexion       views. Impression:            - The examined portion of the ileum was normal.                        - One 3 mm polyp in the ascending colon, removed with                         a cold snare. Resected and retrieved.                        - One 2 mm polyp in the transverse colon, removed with                         a cold snare. Resected and retrieved.                        -  Internal hemorrhoids.                        - The examination was otherwise normal on direct and                         retroflexion views. Recommendation:        - Discharge patient to home.                        - Resume previous diet.                        - Continue present medications.                        - Await pathology results.                        - Repeat colonoscopy in 7 years for surveillance.                        - Return to referring physician as previously                         scheduled. Procedure Code(s):     --- Professional ---                        [id number], Colonoscopy, flexible; with removal of                         tumor(s), polyp(s), or other lesion(s) by snare                         technique Diagnosis Code(s):     --- Professional ---                         Z86.010, Personal history of colonic polyps                        K64.1, Second degree hemorrhoids                        D12.2, Benign neoplasm of ascending colon                        D12.3, Benign neoplasm of transverse colon (hepatic                         flexure or splenic flexure) CPT copyright 2022 American Medical Association. All rights reserved. The codes documented in this report are preliminary and upon coder review may  be revised to meet current compliance requirements. Ole Schick MD, MD 11/18/2023 8:20:27 AM Number of Addenda: 0 Note Initiated On: 11/18/2023 7:46 AM Estimated Blood Loss:  Estimated blood loss was minimal.      [facility]

## 2023-11-20 LAB — SURGICAL PATHOLOGY

## 2023-11-26 ENCOUNTER — Ambulatory Visit (INDEPENDENT_AMBULATORY_CARE_PROVIDER_SITE_OTHER): Admitting: Professional Counselor

## 2023-11-26 DIAGNOSIS — F331 Major depressive disorder, recurrent, moderate: Secondary | ICD-10-CM

## 2023-11-26 DIAGNOSIS — F4310 Post-traumatic stress disorder, unspecified: Secondary | ICD-10-CM | POA: Diagnosis not present

## 2023-11-26 DIAGNOSIS — F4010 Social phobia, unspecified: Secondary | ICD-10-CM

## 2023-11-26 NOTE — Progress Notes (Unsigned)
 THERAPIST PROGRESS NOTE  Session Time: 9:00 AM - 9:53 AM   Participation Level: Active  Behavioral Response: Well Groomed, Alert, Dysphoric  Type of Therapy: Individual Therapy  Treatment Goals addressed: Active OP Depression  LTG: Everything is just taxing to me. People are taxing. People just cause anger, it's unpleasant, I just feel like crap. (Progressing)                Start:  04/01/23    Expected End:  03/30/24      Goal Note Reviewed 09/30/23 Probably in ways I don't see. I am getting something out of this stuff. Even if it's not consciously.     Revised I would just like to accept myself.   STG: In my head and in my heart, I killed two babies. (Abortion/miscarriage) To reduce impact of negative life experiences AEB identifying and restructuring cognitive distortions. (Not Progressing)  Goal Note Reviewed 09/30/23 It used to be out of sight, out of mind. It used to not bother me, but that option is not there anymore. The rational part, I didn't, but the rational part is very small part of me. But I definitely feel it.   STG: I get so startled. To reduce symptoms of anxiety AEB utilizing coping mechanisms and engaging in exposure exercises over the next 90 days.  (Progressing)  Goal Note Reviewed 09/30/23 I'm still having issues of hyper-focusing where my attention is so focused on one thing, it's been more than one time someone will walk right up to me and I don't even see them. I'm always on pins and needles waiting on things to happen. Shin has completed some exposure exercises and they have been successful at times.   STG: Farooq will identify coping strategies to deal with trauma memories and the associated emotional reaction. (Progressing)   Goal Note Reviewed 09/30/23 I'm still gonna go with the out of sight, out of mind. Try to anyway. The remnants of things that happened are still there. I don't feel like a lot of what happened to me was my fault. But that  really doesn't change a lot. It doesn't make anything better. I don't blame myself.   ProgressTowards Goals: Progressing  Interventions: CBT, Motivational Interviewing, Strength-based, and Supportive  Summary: RICHRD KUZNIAR is a 63 y.o. male who presents with a history of anxiety, depression, and trauma. He appeared somber but oriented x5. He stated his colonoscopy went fine. He shared some feelings of disappointment that nothing major was wrong. He reported he is looking forward to a visit from his sister. Avaneesh continues to wonder if therapy has been productive. He was able to identify ways he has improved and was receptive to input from Conservation officer, nature as well. Finnley noted the work he has done feels like Systems analyst and stated we haven't touched the hard stuff. He expressed his ongoing self-hatred and thoughts of being a baby killer as the hard stuff. He seems unsure if he would be able to make changes to these thoughts and attached emotions.   Therapist Response: Conducted session with [name]. Began session with check-in/update since previous session. Utilized empathetic and reflective listening. Used open-ended questions to facilitate discussion and summarized Zadyn's thoughts/feelings. Explored ways in which Dreshaun has made improvements in mood and behaviors. Inquired about easy vs hard stuff. Used Socratic questioning to challenge stuck points and pointed out ways Demetrice can begin to make changes in his thinking. Scheduled additional appointment and concluded session.   Suicidal/Homicidal: No  Plan: Return again in 2 weeks.  Diagnosis: PTSD (post-traumatic stress disorder)  Major depressive disorder, recurrent episode, moderate (HCC)  Social anxiety disorder  Collaboration of Care: Medication Management AEB chart review  Patient/Guardian was advised Release of Information must be obtained prior to any record release in order to collaborate their care with an outside provider.  Patient/Guardian was advised if they have not already done so to contact the registration department to sign all necessary forms in order for us  to release information regarding their care.   Consent: Patient/Guardian gives verbal consent for treatment and assignment of benefits for services provided during this visit. Patient/Guardian expressed understanding and agreed to proceed.   [name], [facility] 11/26/2023

## 2023-11-28 ENCOUNTER — Other Ambulatory Visit: Payer: Self-pay | Admitting: Psychiatry

## 2023-12-01 ENCOUNTER — Ambulatory Visit: Admitting: Sleep Medicine

## 2023-12-08 ENCOUNTER — Encounter: Payer: Self-pay | Admitting: Sleep Medicine

## 2023-12-08 ENCOUNTER — Ambulatory Visit: Admitting: Sleep Medicine

## 2023-12-08 VITALS — BP 120/80 | HR 89 | Temp 98.3°F | Ht 63.0 in | Wt 180.2 lb

## 2023-12-08 DIAGNOSIS — F319 Bipolar disorder, unspecified: Secondary | ICD-10-CM | POA: Diagnosis not present

## 2023-12-08 DIAGNOSIS — G4733 Obstructive sleep apnea (adult) (pediatric): Secondary | ICD-10-CM | POA: Diagnosis not present

## 2023-12-08 NOTE — Progress Notes (Signed)
 Name:Todd Hobbs MRN: [id number] DOB: [birth year]   CHIEF COMPLAINT:  EXCESSIVE DAYTIME SLEEPINESS   HISTORY OF PRESENT ILLNESS: Todd Hobbs is a 63 y.o. w/ a h/o hyperlipidemia, bipolar disorder, anxiety, depression and obesity who present for c/o loud snoring and excessive daytime sleepiness which has been present for several years. Reports nocturnal awakenings due to unclear reasons and occasionally has difficulty falling back to sleep. Denies any significant weight changes. Admits to night sweats and dry mouth. Denies morning headaches, RLS symptoms, dream enactment, cataplexy, hypnagogic or hypnapompic hallucinations. Denies a family history of sleep apnea. Occasional drowsy driving. Drinks 3 cups of coffee daily, denies alcohol or tobacco use, uses marijuana daily.   Bedtime 10-10:30 pm Sleep onset 10 mins Rise time 4:30 am   EPWORTH SLEEP SCORE 8    12/08/2023    1:00 PM  Results of the Epworth flowsheet  Sitting and reading 2  Watching TV 2  Sitting, inactive in a public place (e.g. a theatre or a meeting) 2  As a passenger in a car for an hour without a break 0  Lying down to rest in the afternoon when circumstances permit 1  Sitting and talking to someone 0  Sitting quietly after a lunch without alcohol 1  In a car, while stopped for a few minutes in traffic 0  Total score 8    PAST MEDICAL HISTORY :   has a past medical history of Anxiety, Aortic atherosclerosis, Arthritis, Bipolar 1 disorder (HCC), Cataract cortical, senile, COPD (chronic obstructive pulmonary disease) (HCC), Depression, Gout, History of shingles, HLD (hyperlipidemia), Knee pain, right, Pneumonia, PTSD (post-traumatic stress disorder), Pulmonary fibrosis (HCC), Right inguinal hernia (10/2022), and Vertigo.  has a past surgical history that includes Cataract extraction w/ intraocular lens  implant, bilateral; Wisdom tooth extraction; Tonsillectomy and adenoidectomy (1982); Colonoscopy;  Excision partial phalanx (Right, 06/08/2015); Transurethral resection of bladder tumor (N/A, 07/24/2016); Cheilectomy (Bilateral, 2020); Eye surgery; FUSION FOOT SUBTALAR (N/A); Hernia repair; Colonoscopy (N/A, 11/18/2023); and Polypectomy (11/18/2023). Prior to Admission medications   Medication Sig Start Date End Date Taking? Authorizing Provider  ARIPiprazole  (ABILIFY ) 5 MG tablet Take 1 tablet (5 mg total) by mouth daily. 12/09/23 03/08/24 Yes Hisada, Katheren, MD  atorvastatin (LIPITOR) 40 MG tablet Take 40 mg by mouth at bedtime. 02/21/22  Yes [provider]  celecoxib  (CELEBREX ) 100 MG capsule Take 100 mg by mouth daily.   Yes [provider]  cyclobenzaprine (FLEXERIL) 5 MG tablet Take 5 mg by mouth 3 (three) times daily as needed. 10/03/22  Yes [provider]  Ipratropium-Albuterol (COMBIVENT) 20-100 MCG/ACT AERS respimat Inhale into the lungs. 05/14/23 05/13/24 Yes [provider]  lamoTRIgine (LAMICTAL) 150 MG tablet Take 150 mg by mouth 2 (two) times daily.   Yes [provider]  metFORMIN  (GLUCOPHAGE ) 500 MG tablet Take 1 tablet (500 mg total) by mouth every evening. 07/25/23 12/08/23 Yes Hisada, Katheren, MD  pantoprazole (PROTONIX) 40 MG tablet Take by mouth. 02/25/23 02/25/24 Yes [provider]  prazosin  (MINIPRESS ) 2 MG capsule Take 2 capsules (4 mg total) by mouth at bedtime. 12/17/23 03/16/24 Yes Vickey Katheren, MD  traZODone  (DESYREL ) 100 MG tablet Take 1 tablet (100 mg total) by mouth at bedtime as needed for sleep. Patient taking differently: Take 50 mg by mouth at bedtime as needed for sleep. 09/10/23 12/09/23 Yes Hisada, Reina, MD  venlafaxine  XR (EFFEXOR -XR) 150 MG 24 hr capsule Take 1 capsule (150 mg total) by mouth daily with  breakfast. 11/29/23 02/27/24 Yes Hisada, Katheren, MD  FLUoxetine  (PROZAC ) 20 MG capsule Take 1 capsule (20 mg total) by mouth daily. Total of 60 mg daily. Take along with 40 mg cap Patient not taking: Reported on  11/06/2023 09/12/23 12/11/23  Vickey Katheren, MD  FLUoxetine  (PROZAC ) 40 MG capsule Take 1 capsule (40 mg total) by mouth daily. 08/04/23 01/31/24  Vickey Katheren, MD  venlafaxine  XR (EFFEXOR -XR) 75 MG 24 hr capsule Take 1 capsule (75 mg total) by mouth daily with breakfast for 7 days. 11/06/23 11/13/23  Vickey Katheren, MD   No Known Allergies  FAMILY HISTORY:  family history includes Arrhythmia (age of onset: 64 - 70) in his brother; Arrhythmia (age of onset: 46 - 74) in his mother; Bladder Cancer in his father; Hematuria in his father; Kidney cancer in his father; Lung cancer in his father; Melanoma in his father; Prostate cancer in his brother. SOCIAL HISTORY:  reports that he quit smoking about 7 years ago. His smoking use included cigarettes. He started smoking about 40 years ago. He has a 33 pack-year smoking history. He has never used smokeless tobacco. He reports that he does not currently use alcohol. He reports current drug use. Frequency: 7.00 times per week. Drug: Marijuana.   Review of Systems:  Gen:  Denies  fever, sweats, chills weight loss  HEENT: Denies blurred vision, double vision, ear pain, eye pain, hearing loss, nose bleeds, sore throat Cardiac:  No dizziness, chest pain or heaviness, chest tightness,edema, No JVD Resp:   No cough, -sputum production, -shortness of breath,-wheezing, -hemoptysis,  Gi: Denies swallowing difficulty, stomach pain, nausea or vomiting, diarrhea, constipation, bowel incontinence Gu:  Denies bladder incontinence, burning urine Ext:   Denies Joint pain, stiffness or swelling Skin: Denies  skin rash, easy bruising or bleeding or hives Endoc:  Denies polyuria, polydipsia , polyphagia or weight change Psych:   Denies depression, insomnia or hallucinations  Other:  All other systems negative  VITAL SIGNS: BP 120/80   Pulse 89   Temp 98.3 F (36.8 C)   Ht 5' 3 (1.6 m)   Wt 180 lb 3.2 oz (81.7 kg)   SpO2 93%   BMI 31.92 kg/m    Physical  Examination:   General Appearance: No distress  EYES PERRLA, EOM intact.   NECK Supple, No JVD Pulmonary: normal breath sounds, No wheezing.  CardiovascularNormal S1,S2.  No m/r/g.   Abdomen: Benign, Soft, non-tender. Skin:   warm, no rashes, no ecchymosis  Extremities: normal, no cyanosis, clubbing. Neuro:without focal findings,  speech normal  PSYCHIATRIC: Mood, affect within normal limits.   ASSESSMENT AND PLAN  OSA I suspect that OSA is likely present due to clinical presentation. Discussed the consequences of untreated sleep apnea. Advised not to drive drowsy for safety of patient and others. Will complete further evaluation with a home sleep study and follow up to review results.    Bipolar disorder Stable, on current management. Following with psychiatry.    MEDICATION ADJUSTMENTS/LABS AND TESTS ORDERED: Recommend Sleep Study   Patient  satisfied with Plan of action and management. All questions answered  Follow up to review HST results and treatment plan.   I spent a total of 39 minutes reviewing chart data, face-to-face evaluation with the patient, counseling and coordination of care as detailed above.    Nino Amano, M.D.  Sleep Medicine [facility]

## 2023-12-08 NOTE — Patient Instructions (Signed)
 SABRA

## 2023-12-09 ENCOUNTER — Other Ambulatory Visit: Payer: Self-pay | Admitting: Psychiatry

## 2023-12-10 ENCOUNTER — Ambulatory Visit (INDEPENDENT_AMBULATORY_CARE_PROVIDER_SITE_OTHER): Admitting: Professional Counselor

## 2023-12-10 DIAGNOSIS — F4310 Post-traumatic stress disorder, unspecified: Secondary | ICD-10-CM | POA: Diagnosis not present

## 2023-12-10 DIAGNOSIS — F4010 Social phobia, unspecified: Secondary | ICD-10-CM

## 2023-12-10 DIAGNOSIS — F331 Major depressive disorder, recurrent, moderate: Secondary | ICD-10-CM | POA: Diagnosis not present
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
## 2023-12-21 ENCOUNTER — Other Ambulatory Visit: Payer: Self-pay | Admitting: Psychiatry

## 2023-12-24 ENCOUNTER — Ambulatory Visit: Admitting: Professional Counselor
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
## 2023-12-29 ENCOUNTER — Other Ambulatory Visit: Payer: Self-pay

## 2023-12-29 ENCOUNTER — Ambulatory Visit (INDEPENDENT_AMBULATORY_CARE_PROVIDER_SITE_OTHER): Admitting: Psychiatry

## 2023-12-29 ENCOUNTER — Encounter: Payer: Self-pay | Admitting: Psychiatry

## 2023-12-29 VITALS — BP 104/72 | HR 94 | Temp 97.4°F | Ht 63.0 in | Wt 184.0 lb

## 2023-12-29 DIAGNOSIS — G4700 Insomnia, unspecified: Secondary | ICD-10-CM

## 2023-12-29 DIAGNOSIS — F0630 Mood disorder due to known physiological condition, unspecified: Secondary | ICD-10-CM

## 2023-12-29 DIAGNOSIS — F4010 Social phobia, unspecified: Secondary | ICD-10-CM

## 2023-12-29 DIAGNOSIS — F4310 Post-traumatic stress disorder, unspecified: Secondary | ICD-10-CM | POA: Diagnosis not present

## 2023-12-29 MED ORDER — VENLAFAXINE HCL ER 75 MG PO CP24: 75.0000 mg | ORAL_CAPSULE | Freq: Every day | ORAL | 1 refills | Status: DC

## 2023-12-29 NOTE — Patient Instructions (Signed)
 Continue lamotrigine 150 mg twice a day Increase venlafaxine  225 mg daily  Continue Abilify  5 mg at night  Continue prazosin  4 mg at night Continue trazodone  50 mg at night as needed for insomnia Referred for sleep evaluation  Next appointment: 1/15 at 11 am

## 2024-01-04 ENCOUNTER — Encounter

## 2024-01-04 DIAGNOSIS — G4733 Obstructive sleep apnea (adult) (pediatric): Secondary | ICD-10-CM

## 2024-01-14 DIAGNOSIS — R069 Unspecified abnormalities of breathing: Secondary | ICD-10-CM | POA: Diagnosis not present

## 2024-01-15 ENCOUNTER — Ambulatory Visit: Payer: Self-pay

## 2024-01-15 DIAGNOSIS — G4733 Obstructive sleep apnea (adult) (pediatric): Secondary | ICD-10-CM

## 2024-01-20 ENCOUNTER — Other Ambulatory Visit: Payer: Self-pay | Admitting: Psychiatry

## 2024-01-21 ENCOUNTER — Ambulatory Visit: Admitting: Professional Counselor

## 2024-02-11 ENCOUNTER — Other Ambulatory Visit: Payer: Self-pay | Admitting: Psychiatry

## 2024-02-17 ENCOUNTER — Other Ambulatory Visit: Payer: Self-pay

## 2024-02-17 ENCOUNTER — Encounter: Payer: Self-pay | Admitting: Psychiatry

## 2024-02-17 ENCOUNTER — Ambulatory Visit (INDEPENDENT_AMBULATORY_CARE_PROVIDER_SITE_OTHER): Admitting: Psychiatry

## 2024-02-17 VITALS — BP 121/75 | HR 84 | Temp 98.0°F | Ht 63.0 in | Wt 188.4 lb

## 2024-02-17 DIAGNOSIS — F0630 Mood disorder due to known physiological condition, unspecified: Secondary | ICD-10-CM | POA: Diagnosis not present

## 2024-02-17 DIAGNOSIS — F4010 Social phobia, unspecified: Secondary | ICD-10-CM

## 2024-02-17 DIAGNOSIS — F4310 Post-traumatic stress disorder, unspecified: Secondary | ICD-10-CM

## 2024-02-17 DIAGNOSIS — G4700 Insomnia, unspecified: Secondary | ICD-10-CM | POA: Diagnosis not present

## 2024-02-17 MED ORDER — ARIPIPRAZOLE 5 MG PO TABS: 5.0000 mg | ORAL_TABLET | Freq: Every day | ORAL | 0 refills | Status: AC

## 2024-02-17 MED ORDER — TRAZODONE HCL 100 MG PO TABS: 100.0000 mg | ORAL_TABLET | Freq: Every evening | ORAL | 0 refills | Status: AC | PRN

## 2024-02-17 NOTE — Patient Instructions (Signed)
 Continue lamotrigine 150 mg twice a day Increase venlafaxine  225 mg daily  Continue Abilify  5 mg at night  Continue prazosin  4 mg at night Continue trazodone  50 mg at night as needed for insomnia Next appointment: 2/17 at 8:30
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
## 2024-02-26 ENCOUNTER — Ambulatory Visit: Admitting: Psychiatry

## 2024-03-30 ENCOUNTER — Ambulatory Visit: Admitting: Psychiatry
